# Patient Record
Sex: Male | Born: 1935 | Race: White | Hispanic: No | Marital: Married | Smoking: Never smoker
Health system: Southern US, Community
[De-identification: ages and names within clinical notes are randomized; demographics above are authoritative.]

## PROBLEM LIST (undated history)

## (undated) DIAGNOSIS — D649 Anemia, unspecified: Secondary | ICD-10-CM

## (undated) DIAGNOSIS — Z973 Presence of spectacles and contact lenses: Secondary | ICD-10-CM

## (undated) DIAGNOSIS — I10 Essential (primary) hypertension: Secondary | ICD-10-CM

## (undated) DIAGNOSIS — I2510 Atherosclerotic heart disease of native coronary artery without angina pectoris: Secondary | ICD-10-CM

## (undated) DIAGNOSIS — E669 Obesity, unspecified: Secondary | ICD-10-CM

## (undated) DIAGNOSIS — D494 Neoplasm of unspecified behavior of bladder: Secondary | ICD-10-CM

## (undated) DIAGNOSIS — R350 Frequency of micturition: Secondary | ICD-10-CM

## (undated) DIAGNOSIS — R351 Nocturia: Secondary | ICD-10-CM

## (undated) DIAGNOSIS — D4959 Neoplasm of unspecified behavior of other genitourinary organ: Secondary | ICD-10-CM

## (undated) DIAGNOSIS — E785 Hyperlipidemia, unspecified: Secondary | ICD-10-CM

## (undated) DIAGNOSIS — I219 Acute myocardial infarction, unspecified: Secondary | ICD-10-CM

## (undated) DIAGNOSIS — R319 Hematuria, unspecified: Secondary | ICD-10-CM

## (undated) DIAGNOSIS — R112 Nausea with vomiting, unspecified: Secondary | ICD-10-CM

## (undated) DIAGNOSIS — C801 Malignant (primary) neoplasm, unspecified: Secondary | ICD-10-CM

## (undated) DIAGNOSIS — Z9889 Other specified postprocedural states: Secondary | ICD-10-CM

## (undated) DIAGNOSIS — Z87440 Personal history of urinary (tract) infections: Secondary | ICD-10-CM

## (undated) DIAGNOSIS — K649 Unspecified hemorrhoids: Secondary | ICD-10-CM

## (undated) DIAGNOSIS — I499 Cardiac arrhythmia, unspecified: Secondary | ICD-10-CM

## (undated) HISTORY — PX: CYSTOSCOPY: SUR368

## (undated) HISTORY — PX: CARDIAC CATHETERIZATION: SHX172

## (undated) HISTORY — PX: HEMORROIDECTOMY: SUR656

## (undated) HISTORY — PX: BLADDER SURGERY: SHX569

## (undated) HISTORY — PX: CORONARY ARTERY BYPASS GRAFT: SHX141

---

## 2007-10-07 DIAGNOSIS — D494 Neoplasm of unspecified behavior of bladder: Secondary | ICD-10-CM

## 2007-10-07 DIAGNOSIS — C801 Malignant (primary) neoplasm, unspecified: Secondary | ICD-10-CM

## 2007-10-07 HISTORY — DX: Neoplasm of unspecified behavior of bladder: D49.4

## 2007-10-07 HISTORY — DX: Malignant (primary) neoplasm, unspecified: C80.1

## 2014-12-22 ENCOUNTER — Emergency Department (HOSPITAL_COMMUNITY): Payer: Medicare Other

## 2014-12-22 ENCOUNTER — Emergency Department (HOSPITAL_COMMUNITY)
Admission: EM | Admit: 2014-12-22 | Discharge: 2014-12-22 | Disposition: A | Discharge status: Home / Self Care | Payer: Medicare Other | Attending: Emergency Medicine | Admitting: Emergency Medicine

## 2014-12-22 ENCOUNTER — Encounter (HOSPITAL_COMMUNITY): Payer: Self-pay

## 2014-12-22 ENCOUNTER — Telehealth: Payer: Self-pay | Admitting: *Deleted

## 2014-12-22 DIAGNOSIS — Z79899 Other long term (current) drug therapy: Secondary | ICD-10-CM | POA: Diagnosis not present

## 2014-12-22 DIAGNOSIS — I10 Essential (primary) hypertension: Secondary | ICD-10-CM | POA: Diagnosis not present

## 2014-12-22 DIAGNOSIS — M545 Low back pain, unspecified: Secondary | ICD-10-CM

## 2014-12-22 DIAGNOSIS — Z7982 Long term (current) use of aspirin: Secondary | ICD-10-CM | POA: Insufficient documentation

## 2014-12-22 DIAGNOSIS — E669 Obesity, unspecified: Secondary | ICD-10-CM | POA: Diagnosis not present

## 2014-12-22 DIAGNOSIS — Z7951 Long term (current) use of inhaled steroids: Secondary | ICD-10-CM | POA: Insufficient documentation

## 2014-12-22 DIAGNOSIS — R319 Hematuria, unspecified: Secondary | ICD-10-CM | POA: Insufficient documentation

## 2014-12-22 DIAGNOSIS — Z8551 Personal history of malignant neoplasm of bladder: Secondary | ICD-10-CM | POA: Diagnosis not present

## 2014-12-22 HISTORY — DX: Hyperlipidemia, unspecified: E78.5

## 2014-12-22 HISTORY — DX: Obesity, unspecified: E66.9

## 2014-12-22 HISTORY — DX: Neoplasm of unspecified behavior of bladder: D49.4

## 2014-12-22 HISTORY — DX: Essential (primary) hypertension: I10

## 2014-12-22 HISTORY — DX: Neoplasm of unspecified behavior of other genitourinary organ: D49.59

## 2014-12-22 LAB — COMPREHENSIVE METABOLIC PANEL
ALK PHOS: 67 U/L (ref 39–117)
ALT: 26 U/L (ref 0–53)
AST: 23 U/L (ref 0–37)
Albumin: 3.8 g/dL (ref 3.5–5.2)
Anion gap: 6 (ref 5–15)
BUN: 14 mg/dL (ref 6–23)
CO2: 26 mmol/L (ref 19–32)
CREATININE: 1.01 mg/dL (ref 0.50–1.35)
Calcium: 9.5 mg/dL (ref 8.4–10.5)
Chloride: 108 mmol/L (ref 96–112)
GFR calc Af Amer: 80 mL/min — ABNORMAL LOW (ref >90)
GFR, EST NON AFRICAN AMERICAN: 69 mL/min — AB (ref >90)
GLUCOSE: 104 mg/dL — AB (ref 70–99)
Potassium: 3.9 mmol/L (ref 3.5–5.1)
Sodium: 140 mmol/L (ref 135–145)
TOTAL PROTEIN: 6.5 g/dL (ref 6.0–8.3)
Total Bilirubin: 0.5 mg/dL (ref 0.3–1.2)

## 2014-12-22 LAB — URINE MICROSCOPIC-ADD ON

## 2014-12-22 LAB — URINALYSIS, ROUTINE W REFLEX MICROSCOPIC
Bilirubin Urine: NEGATIVE
Glucose, UA: NEGATIVE mg/dL
Ketones, ur: NEGATIVE mg/dL
Nitrite: NEGATIVE
PROTEIN: NEGATIVE mg/dL
Specific Gravity, Urine: 1.018 (ref 1.005–1.030)
Urobilinogen, UA: 0.2 mg/dL (ref 0.0–1.0)
pH: 5.5 (ref 5.0–8.0)

## 2014-12-22 LAB — CBC WITH DIFFERENTIAL/PLATELET
Basophils Absolute: 0 10*3/uL (ref 0.0–0.1)
Basophils Relative: 1 % (ref 0–1)
EOS PCT: 4 % (ref 0–5)
Eosinophils Absolute: 0.2 10*3/uL (ref 0.0–0.7)
HEMATOCRIT: 39.9 % (ref 39.0–52.0)
Hemoglobin: 13.2 g/dL (ref 13.0–17.0)
Lymphocytes Relative: 18 % (ref 12–46)
Lymphs Abs: 0.9 10*3/uL (ref 0.7–4.0)
MCH: 28.7 pg (ref 26.0–34.0)
MCHC: 33.1 g/dL (ref 30.0–36.0)
MCV: 86.7 fL (ref 78.0–100.0)
MONO ABS: 0.7 10*3/uL (ref 0.1–1.0)
Monocytes Relative: 13 % — ABNORMAL HIGH (ref 3–12)
NEUTROS ABS: 3.3 10*3/uL (ref 1.7–7.7)
Neutrophils Relative %: 64 % (ref 43–77)
Platelets: 162 10*3/uL (ref 150–400)
RBC: 4.6 MIL/uL (ref 4.22–5.81)
RDW: 13.1 % (ref 11.5–15.5)
WBC: 5.1 10*3/uL (ref 4.0–10.5)

## 2014-12-22 LAB — LIPASE, BLOOD: Lipase: 37 U/L (ref 11–59)

## 2014-12-22 MED ORDER — HYDROCODONE-ACETAMINOPHEN 5-325 MG PO TABS: 1.0000 | ORAL_TABLET | ORAL | Status: DC | PRN

## 2014-12-22 MED ORDER — ONDANSETRON 4 MG PO TBDP: ORAL_TABLET | ORAL | Status: DC

## 2014-12-22 MED ORDER — ONDANSETRON HCL 4 MG/2ML IJ SOLN
4.0000 mg | Freq: Once | INTRAMUSCULAR | Status: AC
Start: 2014-12-22 — End: 2014-12-22
  Administered 2014-12-22: 4 mg via INTRAVENOUS
  Filled 2014-12-22: qty 2

## 2014-12-22 MED ORDER — MORPHINE SULFATE 2 MG/ML IJ SOLN
2.0000 mg | Freq: Once | INTRAMUSCULAR | Status: AC
  Administered 2014-12-22: 2 mg via INTRAVENOUS
  Filled 2014-12-22: qty 1

## 2014-12-22 NOTE — ED Notes (Signed)
Pt with bladder cancer history.  Blood in urine.  Started every day this week.  Went to MD yesterday.  Bleeding has eased up.  Urinary pain but decreases post urination.  Able to empty bladder.  No pain.  Rt flank pain

## 2014-12-22 NOTE — ED Provider Notes (Signed)
CSN: [id number]     Arrival date & time 12/22/14  0754 History   First MD Initiated Contact with Patient 12/22/14 0805     Chief Complaint  Patient presents with  . Hematuria  . Flank Pain     (Consider location/radiation/quality/duration/timing/severity/associated sxs/prior Treatment) HPI Patient has a history of bladder cancer. He has however been cleared and had a normal cystoscopy this year. He developed some blood in the urine 5 days ago. He reports he's been urinating exclusively blood up until today when the urine started clearing. He is concerned because she has had severe pain in the right flank as well. Sometimes the pain is extremely intense and it seems to ease after urinating. He describes it as very sharp and indicates his right flank. He has not had fever, chills, nausea or vomiting. He denies any associated abdominal pain. Past Medical History  Diagnosis Date  . Neoplasm of bladder   . Hypertension   . Dyslipidemia   . Obesity   . Neoplasm of urethra    Past Surgical History  Procedure Laterality Date  . Bladder surgery    . Cystoscopy    . Hemorroidectomy     History reviewed. No pertinent family history. History  Substance Use Topics  . Smoking status: Never Smoker   . Smokeless tobacco: Current User  . Alcohol Use: No    Review of Systems  10 Systems reviewed and are negative for acute change except as noted in the HPI.   Allergies  Review of patient's allergies indicates no known allergies.  Home Medications   Prior to Admission medications   Medication Sig Start Date End Date Taking? Authorizing Provider  aspirin 81 MG tablet Take 1 tablet by mouth daily. 08/03/09  Yes Historical Provider, MD  fluticasone (FLONASE) 50 MCG/ACT nasal spray Place 2 sprays into the nose daily as needed for allergies (runny nose).  11/05/12  Yes Historical Provider, MD  lisinopril-hydrochlorothiazide (PRINZIDE,ZESTORETIC) 10-12.5 MG per tablet Take 1 tablet by mouth  daily. 05/22/14  Yes Historical Provider, MD  phenazopyridine (PYRIDIUM) 200 MG tablet Take 1 tablet by mouth daily as needed for pain.    Yes Historical Provider, MD  tamsulosin (FLOMAX) 0.4 MG CAPS capsule Take 1 capsule by mouth daily. Take 1 capsule by mouth 30 minutes after the same meal each day. 05/05/14 05/05/15 Yes Historical Provider, MD  HYDROcodone-acetaminophen (NORCO/VICODIN) 5-325 MG per tablet Take 1-2 tablets by mouth every 4 (four) hours as needed for moderate pain or severe pain. 12/22/14   Charlesetta Shanks, MD  ondansetron (ZOFRAN ODT) 4 MG disintegrating tablet 4mg  ODT q4 hours prn nausea/vomit 12/22/14   Charlesetta Shanks, MD   BP 152/66 mmHg  Pulse 58  Temp(Src) 98.1 F (36.7 C) (Oral)  Resp 18  SpO2 96% Physical Exam  Constitutional: He is oriented to person, place, and time. He appears well-developed and well-nourished.  HENT:  Head: Normocephalic and atraumatic.  Eyes: EOM are normal. Pupils are equal, round, and reactive to light.  Neck: Neck supple.  Cardiovascular: Normal rate, regular rhythm, normal heart sounds and intact distal pulses.   Pulmonary/Chest: Effort normal and breath sounds normal.  Abdominal: Soft. Bowel sounds are normal. He exhibits no distension. There is tenderness.  No reproducible CVA tenderness. Patient however indicates that the area of pain when it happens is in the flank. There is no rash or bruising present. To palpation the patient did endorse discomfort in the right lower quadrant and the suprapubic region. I cannot  appreciate palpable mass.  Musculoskeletal: Normal range of motion. He exhibits no edema.  Neurological: He is alert and oriented to person, place, and time. He has normal strength. Coordination normal. GCS eye subscore is 4. GCS verbal subscore is 5. GCS motor subscore is 6.  Skin: Skin is warm, dry and intact.  Psychiatric: He has a normal mood and affect.    ED Course  Procedures (including critical care time) Labs  Review Labs Reviewed  URINALYSIS, ROUTINE W REFLEX MICROSCOPIC - Abnormal; Notable for the following:    Color, Urine AMBER (*)    APPearance TURBID (*)    Hgb urine dipstick LARGE (*)    Leukocytes, UA TRACE (*)    All other components within normal limits  COMPREHENSIVE METABOLIC PANEL - Abnormal; Notable for the following:    Glucose, Bld 104 (*)    GFR calc non Af Amer 69 (*)    GFR calc Af Amer 80 (*)    All other components within normal limits  CBC WITH DIFFERENTIAL/PLATELET - Abnormal; Notable for the following:    Monocytes Relative 13 (*)    All other components within normal limits  URINE MICROSCOPIC-ADD ON - Abnormal; Notable for the following:    Bacteria, UA FEW (*)    Casts HYALINE CASTS (*)    All other components within normal limits  LIPASE, BLOOD    Imaging Review Ct Renal Stone Study  12/22/2014   CLINICAL DATA:  Right flank pain. History of bladder cancer diagnosed 6 years ago with recent completion of chemotherapy. Blood in urine.  EXAM: CT ABDOMEN AND PELVIS WITHOUT CONTRAST  TECHNIQUE: Multidetector CT imaging of the abdomen and pelvis was performed following the standard protocol without IV contrast.  COMPARISON:  None.  FINDINGS: BODY WALL: No contributory findings.  LOWER CHEST: No contributory findings.  ABDOMEN/PELVIS:  Liver: No focal abnormality.  Biliary: Punctate layering stone in the gallbladder, best visualized on reformats. No acute cholecystitis.  Pancreas: Unremarkable.  Spleen: Unremarkable.  Adrenals: Unremarkable.  Kidneys and ureters: No hydronephrosis or stone. Presumed 22 mm cyst in the lower right kidney.  Bladder: No focal thickening in this patient with history of bladder cancer. No pelvic adenopathy.  Reproductive: Enlarged prostate. Defect near the bladder base may reflect previous trans urethral resection of prostate. Apparent nodular density right lateral of the rectum is related to horizontal asymmetry of the right seminal vesicle. No mass  suspected in this region.  Bowel: Extensive distal colonic diverticulosis. No bowel obstruction or inflammatory wall thickening. Normal appendix.  Retroperitoneum: No mass or adenopathy.  Peritoneum: No ascites or pneumoperitoneum.  Vascular: No acute abnormality.  OSSEOUS: No acute abnormalities.  IMPRESSION: 1. No explanation for acute right flank pain. 2. Prostatomegaly without current urinary retention. 3. Cholelithiasis. 4. Colonic diverticulosis.   Electronically Signed   By: Monte Fantasia M.D.   On: 12/22/2014 08:56     EKG Interpretation None     Consult: Patient's case was reviewed with Dr. Alinda Money of urology. The patient is to call for scheduling of continued outpatient management. MDM   Final diagnoses:  Hematuria  Right-sided low back pain without sciatica   Diagnostic workup does not indicate any anemia or UTI. CT does not show stone or other acute intra-abdominal process, as well there is no evidence of urinary retention. The patient's back pain actually seems to be musculoskeletal in nature. It was worse with movements in the emergency department. There is no radiculopathy associated. The patient will be provided  with pain medication for back pain and instructions for continued urology evaluation for hematuria. Currently there is no indication of urinary retention or clot formation.    Charlesetta Shanks, MD 12/22/14 1101

## 2014-12-22 NOTE — ED Notes (Signed)
MD at bedside. 

## 2014-12-22 NOTE — Discharge Instructions (Signed)

## 2014-12-22 NOTE — Telephone Encounter (Signed)
Pt insurance does not cover ondansetron (ZOFRAN ODT) 4 MG disintegrating tablet. Pharmacy suggested to change to medication covered by insurance.

## 2014-12-22 NOTE — ED Notes (Signed)
Patient transported to CT 

## 2014-12-24 LAB — URINE CULTURE

## 2016-04-05 DIAGNOSIS — I219 Acute myocardial infarction, unspecified: Secondary | ICD-10-CM

## 2016-04-05 HISTORY — DX: Acute myocardial infarction, unspecified: I21.9

## 2016-08-21 ENCOUNTER — Other Ambulatory Visit: Payer: Self-pay | Admitting: Urology

## 2016-08-26 ENCOUNTER — Encounter (HOSPITAL_BASED_OUTPATIENT_CLINIC_OR_DEPARTMENT_OTHER): Payer: Self-pay | Admitting: *Deleted

## 2016-08-26 NOTE — Progress Notes (Addendum)
Spoke with wife-To [facility] at 0700-Istat on arrival-will contact cardiologist Donn Pierini in [place] for recent office visit,Ekg.Instructed Npo after Mn-to refrain from tobacco also-will take diltiazem in am.  ADDENDUM:  RECEIVED D/C NOTE AND EKG FROM SURGEON AT [facility], UPDATED HISTORY IN EPIC AND EKG W/ CHART (IS CURRENT).

## 2016-09-02 ENCOUNTER — Encounter (HOSPITAL_BASED_OUTPATIENT_CLINIC_OR_DEPARTMENT_OTHER): Payer: Self-pay | Admitting: *Deleted

## 2016-09-03 ENCOUNTER — Encounter (HOSPITAL_BASED_OUTPATIENT_CLINIC_OR_DEPARTMENT_OTHER)
Admission: RE | Disposition: A | Discharge status: Home / Self Care | Payer: Self-pay | Source: Ambulatory Visit | Attending: Urology

## 2016-09-03 ENCOUNTER — Ambulatory Visit (HOSPITAL_COMMUNITY): Payer: Medicare Other

## 2016-09-03 ENCOUNTER — Ambulatory Visit (HOSPITAL_BASED_OUTPATIENT_CLINIC_OR_DEPARTMENT_OTHER): Payer: Medicare Other | Admitting: Anesthesiology

## 2016-09-03 ENCOUNTER — Encounter (HOSPITAL_BASED_OUTPATIENT_CLINIC_OR_DEPARTMENT_OTHER): Payer: Self-pay

## 2016-09-03 ENCOUNTER — Ambulatory Visit (HOSPITAL_BASED_OUTPATIENT_CLINIC_OR_DEPARTMENT_OTHER)
Admission: RE | Admit: 2016-09-03 | Discharge: 2016-09-03 | Disposition: A | Discharge status: Home / Self Care | Payer: Medicare Other | Source: Ambulatory Visit | Attending: Urology | Admitting: Urology

## 2016-09-03 DIAGNOSIS — I252 Old myocardial infarction: Secondary | ICD-10-CM | POA: Insufficient documentation

## 2016-09-03 DIAGNOSIS — E785 Hyperlipidemia, unspecified: Secondary | ICD-10-CM | POA: Diagnosis not present

## 2016-09-03 DIAGNOSIS — N1330 Unspecified hydronephrosis: Secondary | ICD-10-CM | POA: Diagnosis not present

## 2016-09-03 DIAGNOSIS — R52 Pain, unspecified: Secondary | ICD-10-CM

## 2016-09-03 DIAGNOSIS — E669 Obesity, unspecified: Secondary | ICD-10-CM | POA: Diagnosis not present

## 2016-09-03 DIAGNOSIS — I10 Essential (primary) hypertension: Secondary | ICD-10-CM | POA: Insufficient documentation

## 2016-09-03 DIAGNOSIS — Z6832 Body mass index (BMI) 32.0-32.9, adult: Secondary | ICD-10-CM | POA: Insufficient documentation

## 2016-09-03 DIAGNOSIS — Z951 Presence of aortocoronary bypass graft: Secondary | ICD-10-CM | POA: Diagnosis not present

## 2016-09-03 DIAGNOSIS — N401 Enlarged prostate with lower urinary tract symptoms: Secondary | ICD-10-CM | POA: Insufficient documentation

## 2016-09-03 DIAGNOSIS — C651 Malignant neoplasm of right renal pelvis: Secondary | ICD-10-CM | POA: Diagnosis not present

## 2016-09-03 DIAGNOSIS — Z8551 Personal history of malignant neoplasm of bladder: Secondary | ICD-10-CM | POA: Insufficient documentation

## 2016-09-03 DIAGNOSIS — F17220 Nicotine dependence, chewing tobacco, uncomplicated: Secondary | ICD-10-CM | POA: Diagnosis not present

## 2016-09-03 DIAGNOSIS — N029 Recurrent and persistent hematuria with unspecified morphologic changes: Secondary | ICD-10-CM | POA: Diagnosis present

## 2016-09-03 DIAGNOSIS — F17290 Nicotine dependence, other tobacco product, uncomplicated: Secondary | ICD-10-CM | POA: Diagnosis not present

## 2016-09-03 DIAGNOSIS — Z79899 Other long term (current) drug therapy: Secondary | ICD-10-CM | POA: Diagnosis not present

## 2016-09-03 DIAGNOSIS — R3912 Poor urinary stream: Secondary | ICD-10-CM | POA: Diagnosis not present

## 2016-09-03 HISTORY — PX: CYSTOSCOPY/RETROGRADE/URETEROSCOPY: SHX5316

## 2016-09-03 HISTORY — PX: CYSTOSCOPY WITH STENT PLACEMENT: SHX5790

## 2016-09-03 HISTORY — DX: Malignant (primary) neoplasm, unspecified: C80.1

## 2016-09-03 HISTORY — DX: Nausea with vomiting, unspecified: R11.2

## 2016-09-03 HISTORY — DX: Acute myocardial infarction, unspecified: I21.9

## 2016-09-03 HISTORY — DX: Nausea with vomiting, unspecified: Z98.890

## 2016-09-03 LAB — POCT I-STAT, CHEM 8
BUN: 20 mg/dL (ref 6–20)
CREATININE: 1.6 mg/dL — AB (ref 0.61–1.24)
Calcium, Ion: 1.26 mmol/L (ref 1.15–1.40)
Chloride: 107 mmol/L (ref 101–111)
Glucose, Bld: 92 mg/dL (ref 65–99)
HEMATOCRIT: 27 % — AB (ref 39.0–52.0)
Hemoglobin: 9.2 g/dL — ABNORMAL LOW (ref 13.0–17.0)
POTASSIUM: 4.1 mmol/L (ref 3.5–5.1)
Sodium: 141 mmol/L (ref 135–145)
TCO2: 23 mmol/L (ref 0–100)

## 2016-09-03 SURGERY — CYSTOSCOPY/RETROGRADE/URETEROSCOPY
Anesthesia: General | Site: Ureter | Laterality: Right

## 2016-09-03 MED ORDER — CEFTRIAXONE SODIUM 1 G IJ SOLR
INTRAMUSCULAR | Status: AC
  Filled 2016-09-03: qty 10

## 2016-09-03 MED ORDER — DEXTROSE 5 % IV SOLN
1.0000 g | INTRAVENOUS | Status: DC
  Administered 2016-09-03: 2 g via INTRAVENOUS
  Filled 2016-09-03: qty 10

## 2016-09-03 MED ORDER — IOHEXOL 300 MG/ML  SOLN
INTRAMUSCULAR | Status: DC | PRN
  Administered 2016-09-03: 10 mL

## 2016-09-03 MED ORDER — LIDOCAINE 2% (20 MG/ML) 5 ML SYRINGE
INTRAMUSCULAR | Status: AC
  Filled 2016-09-03: qty 5

## 2016-09-03 MED ORDER — DEXTROSE 5 % IV SOLN
INTRAVENOUS | Status: AC
  Filled 2016-09-03: qty 50

## 2016-09-03 MED ORDER — DEXTROSE-NACL 5-0.9 % IV SOLN
INTRAVENOUS | Status: DC
  Administered 2016-09-03: 09:00:00 via INTRAVENOUS
  Filled 2016-09-03: qty 1000

## 2016-09-03 MED ORDER — CEFTRIAXONE SODIUM 2 G IJ SOLR
INTRAMUSCULAR | Status: AC
  Filled 2016-09-03: qty 2

## 2016-09-03 MED ORDER — CEFTRIAXONE SODIUM 2 G IJ SOLR
2.0000 g | INTRAMUSCULAR | Status: DC
  Filled 2016-09-03: qty 2

## 2016-09-03 MED ORDER — FENTANYL CITRATE (PF) 100 MCG/2ML IJ SOLN
25.0000 ug | INTRAMUSCULAR | Status: DC | PRN
  Filled 2016-09-03: qty 1

## 2016-09-03 MED ORDER — ONDANSETRON HCL 4 MG/2ML IJ SOLN
INTRAMUSCULAR | Status: AC
  Filled 2016-09-03: qty 2

## 2016-09-03 MED ORDER — LIDOCAINE 2% (20 MG/ML) 5 ML SYRINGE
INTRAMUSCULAR | Status: DC | PRN
  Administered 2016-09-03: 60 mg via INTRAVENOUS

## 2016-09-03 MED ORDER — PROPOFOL 10 MG/ML IV BOLUS
INTRAVENOUS | Status: DC | PRN
  Administered 2016-09-03: 150 mg via INTRAVENOUS

## 2016-09-03 MED ORDER — DEXAMETHASONE SODIUM PHOSPHATE 10 MG/ML IJ SOLN
INTRAMUSCULAR | Status: AC
  Filled 2016-09-03: qty 1

## 2016-09-03 MED ORDER — EPHEDRINE SULFATE-NACL 50-0.9 MG/10ML-% IV SOSY
PREFILLED_SYRINGE | INTRAVENOUS | Status: DC | PRN
  Administered 2016-09-03 (×3): 10 mg via INTRAVENOUS

## 2016-09-03 MED ORDER — DEXAMETHASONE SODIUM PHOSPHATE 4 MG/ML IJ SOLN
INTRAMUSCULAR | Status: DC | PRN
  Administered 2016-09-03: 10 mg via INTRAVENOUS

## 2016-09-03 MED ORDER — ONDANSETRON HCL 4 MG/2ML IJ SOLN
INTRAMUSCULAR | Status: DC | PRN
Start: 2016-09-03 — End: 2016-09-03
  Administered 2016-09-03: 4 mg via INTRAVENOUS

## 2016-09-03 MED ORDER — SENNOSIDES-DOCUSATE SODIUM 8.6-50 MG PO TABS: 1.0000 | ORAL_TABLET | Freq: Two times a day (BID) | ORAL | 0 refills | Status: AC

## 2016-09-03 MED ORDER — MIDAZOLAM HCL 2 MG/2ML IJ SOLN
INTRAMUSCULAR | Status: AC
Start: 2016-09-03 — End: 2016-09-03
  Filled 2016-09-03: qty 2

## 2016-09-03 MED ORDER — LACTATED RINGERS IV SOLN
INTRAVENOUS | Status: DC
  Administered 2016-09-03 (×2): via INTRAVENOUS
  Filled 2016-09-03: qty 1000

## 2016-09-03 MED ORDER — SODIUM CHLORIDE 0.9 % IV SOLN
INTRAVENOUS | Status: DC | PRN
  Administered 2016-09-03: 10:00:00 via INTRAVENOUS

## 2016-09-03 MED ORDER — FENTANYL CITRATE (PF) 100 MCG/2ML IJ SOLN
INTRAMUSCULAR | Status: AC
  Filled 2016-09-03: qty 2

## 2016-09-03 MED ORDER — HYDROCODONE-ACETAMINOPHEN 5-325 MG PO TABS: 1.0000 | ORAL_TABLET | ORAL | 0 refills | Status: DC | PRN

## 2016-09-03 MED ORDER — SODIUM CHLORIDE 0.9 % IR SOLN
Status: DC | PRN
  Administered 2016-09-03: 4000 mL

## 2016-09-03 MED ORDER — FENTANYL CITRATE (PF) 100 MCG/2ML IJ SOLN
INTRAMUSCULAR | Status: DC | PRN
  Administered 2016-09-03: 50 ug via INTRAVENOUS
  Administered 2016-09-03 (×2): 25 ug via INTRAVENOUS

## 2016-09-03 MED ORDER — PROPOFOL 10 MG/ML IV BOLUS
INTRAVENOUS | Status: AC
  Filled 2016-09-03: qty 40

## 2016-09-03 SURGICAL SUPPLY — 27 items
BAG DRAIN URO-CYSTO SKYTR STRL (DRAIN) ×4 IMPLANT
BASKET LASER NITINOL 1.9FR (BASKET) ×4 IMPLANT
BASKET ZERO TIP NITINOL 2.4FR (BASKET) IMPLANT
CATH INTERMIT  6FR 70CM (CATHETERS) IMPLANT
CLOTH BEACON ORANGE TIMEOUT ST (SAFETY) ×4 IMPLANT
DRSG TELFA 3X8 NADH (GAUZE/BANDAGES/DRESSINGS) ×4 IMPLANT
FORCEPS BIOP 2.4F 115CM BACKLD (INSTRUMENTS) ×4 IMPLANT
GLOVE BIO SURGEON STRL SZ7.5 (GLOVE) ×4 IMPLANT
GLOVE ECLIPSE 7.0 STRL STRAW (GLOVE) ×4 IMPLANT
GLOVE INDICATOR 7.0 STRL GRN (GLOVE) ×4 IMPLANT
GOWN STRL REUS W/ TWL XL LVL3 (GOWN DISPOSABLE) ×2 IMPLANT
GOWN STRL REUS W/TWL XL LVL3 (GOWN DISPOSABLE) ×6 IMPLANT
GUIDEWIRE ANG ZIPWIRE 038X150 (WIRE) ×4 IMPLANT
GUIDEWIRE STR DUAL SENSOR (WIRE) ×4 IMPLANT
IV NS 1000ML (IV SOLUTION) ×2
IV NS 1000ML BAXH (IV SOLUTION) ×2 IMPLANT
IV NS IRRIG 3000ML ARTHROMATIC (IV SOLUTION) ×4 IMPLANT
KIT ROOM TURNOVER WOR (KITS) ×4 IMPLANT
MANIFOLD NEPTUNE II (INSTRUMENTS) ×4 IMPLANT
NS IRRIG 500ML POUR BTL (IV SOLUTION) IMPLANT
PACK CYSTO (CUSTOM PROCEDURE TRAY) ×4 IMPLANT
SHEATH ACCESS URETERAL 38CM (SHEATH) ×4 IMPLANT
STENT URET 6FRX26 CONTOUR (STENTS) ×4 IMPLANT
SYRINGE 10CC LL (SYRINGE) ×4 IMPLANT
TUBE CONNECTING 12'X1/4 (SUCTIONS) ×1
TUBE CONNECTING 12X1/4 (SUCTIONS) ×3 IMPLANT
TUBE FEEDING 8FR 16IN STR KANG (MISCELLANEOUS) ×4 IMPLANT

## 2016-09-03 NOTE — H&P (Signed)
Nathaniel Ponce is an 82 y.o. male.    Chief Complaint: Pre-op Cysto, Bilateral REtrogrades, Diagnostic RIGHT ureteroscopy  HPI:   1 - Recurrent Bladder Cancer - long h/o non-muscle invasive bladder cancer managed by Lorine Bears at [facility]. Mostly Ta, T1 some low grade and some high grade. He is s/p BCG induction 2009, Gemzar induction 2012, BCG +INF Re-induction 2015. Most recent reccurence 4cm Ta low grade 05/2014 per records close to UO req peri-op stent.    Recent Surveillance:   02/2015 Cysto NED; 08/2015 Cysto NED   02/2016 Cysto NED; 08/2016 Cysto NED    2 - Enlarged Prostate with Weak Urinary Stream - s/p prior TURP at [facility], now slowly progressive bother again, started finasteride 08/2015.    3 - Gross Hematuria / RIght Hydronephrosis - new gross hematuria and irritative voiding 08/2016. H/o bladder cancer as per above. He states he was treated for cystitis recnetly but gross blood continues to come and go. UCX negative and cysto negative last visit. CT from [facility] with new moderate right hydro and blood density material in collecting system without stone.   PMH sig for HTN, CAD/CABG (no strong blood thinners). His PCP is Dr. Marveen Reeks in [place]. His daughter is RN with [employer] and very involved in his management.    Today " Nathaniel Ponce " is seen to proceed with diagnostic right ureteroscopy with goal of ruling out new right sided upper tract urothelial malignancy. Interval UCX negative.    Past Medical History:  Diagnosis Date  . Cancer [facility]) 2009   bladder cancer  . Dyslipidemia   . Hypertension   . Myocardial infarction 04/2016  . Neoplasm of bladder 2009  . Neoplasm of urethra   . Obesity   . PONV (postoperative nausea and vomiting)     Past Surgical History:  Procedure Laterality Date  . BLADDER SURGERY     TURBT-multiple last 05/22/2014  . CORONARY ARTERY BYPASS GRAFT  04/14/2016   at [facility] (dr Lincoln Brigham)   x3 grafts-- LIMA to LAD,  SVG to PDA,  SVG to OM  . CYSTOSCOPY    .  HEMORROIDECTOMY      History reviewed. No pertinent family history. Social History:  reports that he quit smoking about 45 years ago. His smoking use included Cigars. His smokeless tobacco use includes Chew and Snuff. He reports that he does not drink alcohol or use drugs.  Allergies: No Known Allergies  No prescriptions prior to admission.    No results found for this or any previous visit (from the past 48 hour(s)). No results found.  Review of Systems  Constitutional: Negative.  Negative for chills, fever and malaise/fatigue.  HENT: Negative.   Eyes: Negative.   Respiratory: Negative.   Cardiovascular: Negative.   Gastrointestinal: Negative.   Genitourinary: Positive for hematuria.  Musculoskeletal: Negative.   Skin: Negative.   Neurological: Negative.   Endo/Heme/Allergies: Negative.   Psychiatric/Behavioral: Negative.     Height 5\' 10"  (1.778 m), weight 103.4 kg (228 lb). Physical Exam  Constitutional: He is oriented to person, place, and time. He appears well-developed.  HENT:  Head: Normocephalic.  Eyes: Pupils are equal, round, and reactive to light.  Neck: Normal range of motion.  Cardiovascular: Normal rate.   Respiratory: Effort normal.  GI: Soft.  Genitourinary: Penis normal.  Genitourinary Comments: No CVAT at present  Musculoskeletal: Normal range of motion.  Neurological: He is alert and oriented to person, place, and time.  Skin: Skin is warm.  Psychiatric: He has  a normal mood and affect.     Assessment/Plan  Proceed as planned with likely outpatient diagnostic right ureteroscopy. Risks, benefits, alternatives, expected peri-op course, need for temporary stents / catheters discussed previously and reiterated today.    Alexis Frock, MD 09/03/2016, 6:36 AM

## 2016-09-03 NOTE — Brief Op Note (Signed)
09/03/2016  10:42 AM  PATIENT:  Enid Baas  82 y.o. male  PRE-OPERATIVE DIAGNOSIS:  RIGHT HYDRONEPHROSIS, HISTORY OF BLADDER CANCER  POST-OPERATIVE DIAGNOSIS:  RIGHT HYDRONEPHROSIS, HISTORY OF BLADDER CANCER  PROCEDURE:  Procedure(s): CYSTOSCOPY/RETROGRADE/RIGHT DIAGNOSTIC URETEROSCOPY (Bilateral) CYSTOSCOPY WITH STENT PLACEMENT (Right)  SURGEON:  Surgeon(s) and Role:    * Alexis Frock, MD - Primary  PHYSICIAN ASSISTANT:   ASSISTANTS: none   ANESTHESIA:   general  EBL:  Total I/O In: 650 [I.V.:650] Out: 10 [Blood:10]  BLOOD ADMINISTERED:none  DRAINS: none   LOCAL MEDICATIONS USED:  NONE  SPECIMEN:  Source of Specimen:  1 - Rt renal pelvis mass BX x2  DISPOSITION OF SPECIMEN:  PATHOLOGY  COUNTS:  YES  TOURNIQUET:  * No tourniquets in log *  DICTATION: .Other Dictation: Dictation Number [id number]  PLAN OF CARE: Discharge to home after PACU  PATIENT DISPOSITION:  PACU - hemodynamically stable.   Delay start of Pharmacological VTE agent (>24hrs) due to surgical blood loss or risk of bleeding: yes

## 2016-09-03 NOTE — Discharge Instructions (Signed)
1 - You may have urinary urgency (bladder spasms) and bloody urine on / off with stent in place. This is normal.  2 - Dr. Tresa Moore will call you with pathology results when available.   3 - Call MD or go to ER for fever >102, severe pain / nausea / vomiting not relieved by medications, or acute change in medical status  [facility] [phone] Post Ureteroscopy With or Without Stent Instructions  Definitions:  Ureter: The duct that transports urine from the kidney to the bladder. Stent:   A plastic hollow tube that is placed into the ureter, from the kidney to the                 bladder to prevent the ureter from swelling shut.  GENERAL INSTRUCTIONS:  Despite the fact that no skin incisions were used, the area around the ureter and bladder is raw and irritated. The stent is a foreign body which will further irritate the bladder wall. This irritation is manifested by increased frequency of urination, both day and night, and by an increase in the urge to urinate. In some, the urge to urinate is present almost always. Sometimes the urge is strong enough that you may not be able to stop yourself from urinating. The only real cure is to remove the stent and then give time for the bladder wall to heal which can't be done until the danger of the ureter swelling shut has passed, which varies.  You may see some blood in your urine while the stent is in place and a few days afterwards. Do not be alarmed, even if the urine was clear for a while. Get off your feet and drink lots of fluids until clearing occurs. If you start to pass clots or don't improve, call us.  DIET: You may return to your normal diet immediately. Because of the raw surface of your bladder, alcohol, spicy foods, acid type foods and drinks with caffeine may cause irritation or frequency and should be used in moderation. To keep your urine flowing freely and to avoid constipation, drink plenty of fluids during the day (  8-10 glasses ). Tip: Avoid cranberry juice because it is very acidic.  ACTIVITY: Your physical activity doesn't need to be restricted. However, if you are very active, you may see some blood in your urine. We suggest that you reduce your activity under these circumstances until the bleeding has stopped.  BOWELS: It is important to keep your bowels regular during the postoperative period. Straining with bowel movements can cause bleeding. A bowel movement every other day is reasonable. Use a mild laxative if needed, such as Milk of Magnesia 2-3 tablespoons, or 2 Dulcolax tablets. Call if you continue to have problems. If you have been taking narcotics for pain, before, during or after your surgery, you may be constipated. Take a laxative if necessary.   MEDICATION: You should resume your pre-surgery medications unless told not to. In addition you will often be given an antibiotic to prevent infection. These should be taken as prescribed until the bottles are finished unless you are having an unusual reaction to one of the drugs.  PROBLEMS YOU SHOULD REPORT TO [place]:  Fevers over 100.5 Fahrenheit.  Heavy bleeding, or clots ( See above notes about blood in urine ).  Inability to urinate.  Drug reactions ( hives, rash, nausea, vomiting, diarrhea ).  Severe burning or pain with urination that is not improving.  FOLLOW-UP: You will need a  follow-up appointment to monitor your progress. Call for this appointment at the number listed above. Usually the first appointment will be about three to fourteen days after your surgery.    Post Anesthesia Home Care Instructions  Activity: Get plenty of rest for the remainder of the day. A responsible adult should stay with you for 24 hours following the procedure.  For the next 24 hours, DO NOT: -Drive a car -Paediatric nurse -Drink alcoholic beverages -Take any medication unless instructed by your physician -Make any legal decisions or sign  important papers.  Meals: Start with liquid foods such as gelatin or soup. Progress to regular foods as tolerated. Avoid greasy, spicy, heavy foods. If nausea and/or vomiting occur, drink only clear liquids until the nausea and/or vomiting subsides. Call your physician if vomiting continues.  Special Instructions/Symptoms: Your throat may feel dry or sore from the anesthesia or the breathing tube placed in your throat during surgery. If this causes discomfort, gargle with warm salt water. The discomfort should disappear within 24 hours.  If you had a scopolamine patch placed behind your ear for the management of post- operative nausea and/or vomiting:  1. The medication in the patch is effective for 72 hours, after which it should be removed.  Wrap patch in a tissue and discard in the trash. Wash hands thoroughly with soap and water. 2. You may remove the patch earlier than 72 hours if you experience unpleasant side effects which may include dry mouth, dizziness or visual disturbances. 3. Avoid touching the patch. Wash your hands with soap and water after contact with the patch.

## 2016-09-03 NOTE — Anesthesia Postprocedure Evaluation (Signed)
Anesthesia Post Note  Patient: Nathaniel Ponce  Procedure(s) Performed: Procedure(s) (LRB): CYSTOSCOPY/RETROGRADE/RIGHT DIAGNOSTIC URETEROSCOPY (Bilateral) CYSTOSCOPY WITH STENT PLACEMENT (Right)  Patient location during evaluation: PACU Anesthesia Type: General Level of consciousness: awake and alert Pain management: pain level controlled Vital Signs Assessment: post-procedure vital signs reviewed and stable Respiratory status: spontaneous breathing, nonlabored ventilation, respiratory function stable and patient connected to nasal cannula oxygen Cardiovascular status: blood pressure returned to baseline and stable Postop Assessment: no signs of nausea or vomiting Anesthetic complications: no    Last Vitals:  Vitals:   09/03/16 1145 09/03/16 1222  BP: (!) 147/65 (!) 158/69  Pulse: 71   Resp: 12 16  Temp:  37.1 C    Last Pain:  Vitals:   09/03/16 1222  TempSrc: Tympanic                 Effie Berkshire

## 2016-09-03 NOTE — Anesthesia Preprocedure Evaluation (Addendum)
Anesthesia Evaluation  Patient identified by MRN, date of birth, ID band Patient awake    History of Anesthesia Complications (+) PONV  Airway Mallampati: I  TM Distance: >3 FB Neck ROM: Full    Dental  (+) Edentulous Upper, Edentulous Lower   Pulmonary former smoker,    breath sounds clear to auscultation       Cardiovascular Exercise Tolerance: Good hypertension, Pt. on medications + CAD and + Past MI   Rhythm:Regular Rate:Normal     Neuro/Psych Depression    GI/Hepatic negative GI ROS, Neg liver ROS,   Endo/Other  negative endocrine ROS  Renal/GU negative Renal ROS     Musculoskeletal negative musculoskeletal ROS (+)   Abdominal   Peds  Hematology negative hematology ROS (+)   Anesthesia Other Findings   Reproductive/Obstetrics                            Anesthesia Physical Anesthesia Plan  ASA: III  Anesthesia Plan: General   Post-op Pain Management:    Induction: Intravenous  Airway Management Planned: LMA  Additional Equipment:   Intra-op Plan:   Post-operative Plan: Extubation in OR  Informed Consent: I have reviewed the patients History and Physical, chart, labs and discussed the procedure including the risks, benefits and alternatives for the proposed anesthesia with the patient or authorized representative who has indicated his/her understanding and acceptance.   Dental advisory given  Plan Discussed with: CRNA  Anesthesia Plan Comments: (Spoke with Dr. Sondra Come on the phone. He will fax over recent visit. Per his evaluation, he is cleared from a cardiac standpoint. )        Anesthesia Quick Evaluation

## 2016-09-03 NOTE — Transfer of Care (Signed)
Immediate Anesthesia Transfer of Care Note  Patient: Nathaniel Ponce  Procedure(s) Performed: Procedure(s): CYSTOSCOPY/RETROGRADE/RIGHT DIAGNOSTIC URETEROSCOPY (Bilateral) CYSTOSCOPY WITH STENT PLACEMENT (Right)  Patient Location: PACU  Anesthesia Type:General  Level of Consciousness: awake, alert , oriented and patient cooperative  Airway & Oxygen Therapy: Patient Spontanous Breathing and Patient connected to nasal cannula oxygen  Post-op Assessment: Report given to RN and Post -op Vital signs reviewed and stable  Post vital signs: Reviewed and stable  Last Vitals:  Vitals:   09/03/16 0729  BP: (!) 148/89  Pulse: 65  Resp: 20  Temp: 36.8 C    Last Pain:  Vitals:   09/03/16 0729  TempSrc: Oral      Patients Stated Pain Goal: 8 ([id number])  Complications: No apparent anesthesia complications

## 2016-09-03 NOTE — Anesthesia Procedure Notes (Signed)
Procedure Name: LMA Insertion Date/Time: 09/03/2016 9:44 AM Performed by: Wanita Chamberlain Pre-anesthesia Checklist: Patient identified, Timeout performed, Emergency Drugs available, Suction available and Patient being monitored Patient Re-evaluated:Patient Re-evaluated prior to inductionOxygen Delivery Method: Circle system utilized Preoxygenation: Pre-oxygenation with 100% oxygen Intubation Type: IV induction Ventilation: Mask ventilation without difficulty LMA: LMA inserted LMA Size: 5.0 Number of attempts: 1 Placement Confirmation: breath sounds checked- equal and bilateral and positive ETCO2 Tube secured with: Tape Dental Injury: Teeth and Oropharynx as per pre-operative assessment

## 2016-09-04 ENCOUNTER — Encounter (HOSPITAL_BASED_OUTPATIENT_CLINIC_OR_DEPARTMENT_OTHER): Payer: Self-pay | Admitting: Urology

## 2016-09-04 NOTE — Op Note (Signed)
[name], Ponce NO.:  [id number]  MEDICAL RECORD NO.:  [id number]  LOCATION:                                 FACILITY:  PHYSICIAN:  Alexis Frock, MD     DATE OF BIRTH:  [birth year]  DATE OF PROCEDURE: 09/03/2016                              OPERATIVE REPORT   PREOPERATIVE DIAGNOSES:  Recurrent hematuria, questionable right renal pelvis mass, right hydronephrosis, history of bladder cancer.  PROCEDURES: 1. Cystoscopy with bilateral retrograde pyelogram with interpretation. 2. Right ureteroscopy with biopsy. 3. Insertion of right ureteral stent, 6 x 26, Contour, no tether.  ESTIMATED BLOOD LOSS:  Nil.  COMPLICATION:  None.  SPECIMEN:  Right renal pelvis mass biopsy x2 for permanent pathology.  FINDINGS: 1. Unremarkable urinary bladder. 2. Unremarkable left retrograde pyelogram. 3. Very large right renal pelvis filling defect consistent with     urothelial carcinoma. 4. Multiple in-situ blood clots within right ureter, likely source of     the patient's hematuria and right-sided flank pain.  INDICATIONS:  Nathaniel Ponce is an 82 year old gentleman with long history of superficial bladder cancer.  He has been cared for by multiple providers throughout the [place] previously.  He has transferred his care to me recently.  He has been compliant with bladder surveillance and he developed recent new-onset gross hematuria.  He underwent a CT scan in [place] that the family was told was normal.  His most recent cystoscopy was unremarkable and adamantly requested review of the images.  The patient's family brought the images for my review and the CT scan was markedly abnormal with new right hydronephrosis and likely soft tissue density within the right renal pelvis, this is highly concerning for new right renal pelvis cancer.  It was felt that cystoscopy with retrograde pyelography and ureteroscopy with possible biopsy were clearly indicated and they  wished to proceed.  Informed consent was obtained and placed in the medical record.  PROCEDURE IN DETAIL:  The patient being Nathaniel Ponce, was verified. Procedure being cysto, bilateral retrogrades and right ureteroscopy was confirmed.  Procedure was carried out.  Time-out was performed. Intravenous antibiotics were administered.  General LMA anesthesia was introduced.  The patient was placed into a low lithotomy position and sterile field was created by prepping and draping the patient's penis, perineum and proximal thighs using iodine x3.  Next, cystourethroscopy was performed using a rigid cystoscope with offset lens.  Inspection of the anterior and posterior urethra revealed only prior TURP defect. Inspection of the urinary bladder revealed no diverticula, calcifications, papillary lesions.  There was evidence of prior resection of ureteral orifices, but there were bilaterally patent.  The left ureteral orifice was cannulated with a 6-French end-hole catheter and left retrograde pyelogram was obtained.  Left retrograde pyelogram demonstrated a single left ureter with single- system left kidney.  No filling defects or narrowing noted.  Similarly, right retrograde pyelogram was obtained.  Retrograde pyelogram demonstrated a single right ureter with single- system right kidney.  There was mobile large filling defect in the right ureter likely consistent with blood clot.  There was a very large filling defect within the right renal pelvis and upper pole area,  highly worrisome for primary urothelial carcinoma of the kidney.  A 0.038 Zip wire was advanced to the level of the upper pole, set aside as a safety wire.  An 8-French feeding tube was placed in the urinary bladder for pressure release and semi-rigid ureteroscopy was performed of the distal four-fifths of the right ureter alongside a separate Sensor working wire.  As expected, there were multifocal long stringy blood  clots throughout the right ureter, but without urothelial lesions from the ureter, this was consistent with likely renal bleeding.  The semi-rigid scope was then exchanged for 12/14, 38-cm ureteral access sheath at the level of the proximal ureter and flexible digital ureteroscopy was performed of the proximal right ureter and systematic inspection of the right kidney.  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 It was clearly felt that biopsy was warranted.  As such, an Escape basket was used to snare some small pieces of tissue that was set aside for permanent pathology.  Also, a BIGopsy apparatus was backloaded and using combination of ureteroscopic and radiographic guidance, multiple additional biopsies were taken of the renal pelvis mass, set aside for permanent pathology.  Given the patient's ongoing bleeding and clot colic, it was felt that interval stenting would clearly be warranted. As such, the sheath was removed under continuous vision, no mucosal abnormalities were found, and a new 6 x 26 Contour-type stent was placed over the remaining safety wire using cystoscopic and fluoroscopic guidance.  Good proximal and distal deployment were noted.  Bladder was emptied per cystoscope, procedure was then terminated.  The patient tolerated the procedure well.  There were no immediate periprocedural complications.  The patient was taken to the postanesthesia care unit in stable condition.    ______________________________ Alexis Frock, MD   ______________________________ Alexis Frock, MD    TM/MEDQ  D:  09/03/2016  T:  09/04/2016  Job:  [id number]

## 2016-09-15 ENCOUNTER — Other Ambulatory Visit: Payer: Self-pay | Admitting: Urology

## 2016-09-16 NOTE — Progress Notes (Signed)
Pt is being scheduled for preop appt; please place surgical orders in epic. Thanks.  

## 2016-10-02 ENCOUNTER — Ambulatory Visit (HOSPITAL_COMMUNITY)
Admission: RE | Admit: 2016-10-02 | Discharge: 2016-10-02 | Disposition: A | Discharge status: Home / Self Care | Payer: Medicare Other | Source: Ambulatory Visit | Attending: Anesthesiology | Admitting: Anesthesiology

## 2016-10-02 ENCOUNTER — Encounter (HOSPITAL_COMMUNITY): Payer: Self-pay

## 2016-10-02 ENCOUNTER — Encounter (HOSPITAL_COMMUNITY)
Admission: RE | Admit: 2016-10-02 | Discharge: 2016-10-02 | Disposition: A | Discharge status: Home / Self Care | Payer: Medicare Other | Source: Ambulatory Visit | Attending: Urology | Admitting: Urology

## 2016-10-02 DIAGNOSIS — Z01818 Encounter for other preprocedural examination: Secondary | ICD-10-CM | POA: Diagnosis present

## 2016-10-02 DIAGNOSIS — Z01812 Encounter for preprocedural laboratory examination: Secondary | ICD-10-CM | POA: Insufficient documentation

## 2016-10-02 DIAGNOSIS — R9389 Abnormal findings on diagnostic imaging of other specified body structures: Secondary | ICD-10-CM

## 2016-10-02 DIAGNOSIS — C651 Malignant neoplasm of right renal pelvis: Secondary | ICD-10-CM | POA: Diagnosis not present

## 2016-10-02 DIAGNOSIS — L905 Scar conditions and fibrosis of skin: Secondary | ICD-10-CM | POA: Diagnosis not present

## 2016-10-02 HISTORY — DX: Nocturia: R35.1

## 2016-10-02 HISTORY — DX: Unspecified hemorrhoids: K64.9

## 2016-10-02 HISTORY — DX: Hematuria, unspecified: R31.9

## 2016-10-02 HISTORY — DX: Frequency of micturition: R35.0

## 2016-10-02 HISTORY — DX: Presence of spectacles and contact lenses: Z97.3

## 2016-10-02 HISTORY — DX: Atherosclerotic heart disease of native coronary artery without angina pectoris: I25.10

## 2016-10-02 HISTORY — DX: Anemia, unspecified: D64.9

## 2016-10-02 HISTORY — DX: Personal history of urinary (tract) infections: Z87.440

## 2016-10-02 HISTORY — DX: Cardiac arrhythmia, unspecified: I49.9

## 2016-10-02 LAB — CBC
HCT: 27.3 % — ABNORMAL LOW (ref 39.0–52.0)
HEMOGLOBIN: 8.1 g/dL — AB (ref 13.0–17.0)
MCH: 20.9 pg — AB (ref 26.0–34.0)
MCHC: 29.7 g/dL — AB (ref 30.0–36.0)
MCV: 70.5 fL — AB (ref 78.0–100.0)
Platelets: 404 10*3/uL — ABNORMAL HIGH (ref 150–400)
RBC: 3.87 MIL/uL — AB (ref 4.22–5.81)
RDW: 16.1 % — ABNORMAL HIGH (ref 11.5–15.5)
WBC: 7.5 10*3/uL (ref 4.0–10.5)

## 2016-10-02 LAB — BASIC METABOLIC PANEL
Anion gap: 7 (ref 5–15)
BUN: 19 mg/dL (ref 6–20)
CALCIUM: 9.9 mg/dL (ref 8.9–10.3)
CHLORIDE: 106 mmol/L (ref 101–111)
CO2: 26 mmol/L (ref 22–32)
CREATININE: 1.41 mg/dL — AB (ref 0.61–1.24)
GFR calc non Af Amer: 45 mL/min — ABNORMAL LOW (ref >60)
GFR, EST AFRICAN AMERICAN: 53 mL/min — AB (ref >60)
GLUCOSE: 96 mg/dL (ref 65–99)
Potassium: 4.3 mmol/L (ref 3.5–5.1)
Sodium: 139 mmol/L (ref 135–145)

## 2016-10-02 LAB — ABO/RH: ABO/RH(D): O POS

## 2016-10-02 NOTE — Progress Notes (Signed)
CBC results in epic per PAT visit 10/02/2016 sent to Dr Tresa Moore

## 2016-10-02 NOTE — Progress Notes (Signed)
BMP results in epic per PAT visit 10/02/2016 sent to Dr Tresa Moore

## 2016-10-02 NOTE — Progress Notes (Addendum)
EKG per chart 05/01/2016  OV note per chart per Gwinda Maine PA/cardiothoracic surgery 05/01/2016  ECHO report per chart 04/09/2016  Clearance note per chart per Dr Sondra Come which discussed aspirin; discussed this with Dr Glennon Mac / anesthesia whom stated pt needs to continue 81 mg aspirin daily. Family is aware to restart pt back on aspirin today-had stopped on Tuesday 09/30/2016

## 2016-10-02 NOTE — Patient Instructions (Signed)
Nathaniel Ponce  10/02/2016   Your procedure is scheduled on: Wednesday October 08, 2016  Report to [facility]  Entrance take [place]  elevators to 3rd floor to  [place] at 11:00 AM.  Call this number if you have problems the morning of surgery [phone]   Remember: ONLY 1 PERSON MAY GO WITH YOU TO SHORT STAY TO GET  READY MORNING OF [place].  Do not eat food After Midnight but may take clear liquid diet till 7:00 am day of surgery then nothing by mouth.      Take these medicines the morning of surgery with A SIP OF WATER: Diltiazem; Finasteride (Proscar); May use Flonase if needed                                 You may not have any metal on your body including hair pins and              piercings  Do not wear jewelry,  lotions, powders or colognes, deodorant              Men may shave face and neck.   Do not bring valuables to the hospital. [place].  Contacts, dentures or bridgework may not be worn into surgery.  Leave suitcase in the car. After surgery it may be brought to your room.    Special Instructions: FOLLOW SURGEON'S INSTRUCTIONS IN REGARDS TO BOWEL PREPARATION PRIOR TO SURGICAL PROCEDURE DATE           _____________________________________________________________________             [facility] - Preparing for Surgery Before surgery, you can play an important role.  Because skin is not sterile, your skin needs to be as free of germs as possible.  You can reduce the number of germs on your skin by washing with CHG (chlorahexidine gluconate) soap before surgery.  CHG is an antiseptic cleaner which kills germs and bonds with the skin to continue killing germs even after washing. Please DO NOT use if you have an allergy to CHG or antibacterial soaps.  If your skin becomes reddened/irritated stop using the CHG and inform your nurse when you arrive at Short Stay. Do not shave  (including legs and underarms) for at least 48 hours prior to the first CHG shower.  You may shave your face/neck. Please follow these instructions carefully:  1.  Shower with CHG Soap the night before surgery and the  morning of Surgery.  2.  If you choose to wash your hair, wash your hair first as usual with your  normal  shampoo.  3.  After you shampoo, rinse your hair and body thoroughly to remove the  shampoo.                           4.  Use CHG as you would any other liquid soap.  You can apply chg directly  to the skin and wash                       Gently with a scrungie or clean washcloth.  5.  Apply the CHG Soap to your  body ONLY FROM THE NECK DOWN.   Do not use on face/ open                           Wound or open sores. Avoid contact with eyes, ears mouth and genitals (private parts).                       Wash face,  Genitals (private parts) with your normal soap.             6.  Wash thoroughly, paying special attention to the area where your surgery  will be performed.  7.  Thoroughly rinse your body with warm water from the neck down.  8.  DO NOT shower/wash with your normal soap after using and rinsing off  the CHG Soap.                9.  Pat yourself dry with a clean towel.            10.  Wear clean pajamas.            11.  Place clean sheets on your bed the night of your first shower and do not  sleep with pets. Day of Surgery : Do not apply any lotions/deodorants the morning of surgery.  Please wear clean clothes to the hospital/surgery center.  FAILURE TO FOLLOW THESE INSTRUCTIONS MAY RESULT IN THE CANCELLATION OF YOUR SURGERY PATIENT SIGNATURE_________________________________  NURSE SIGNATURE__________________________________  ________________________________________________________________________    CLEAR LIQUID DIET   Foods Allowed                                                                     Foods Excluded  Coffee and tea, regular and decaf                              liquids that you cannot  Plain Jell-O in any flavor                                             see through such as: Fruit ices (not with fruit pulp)                                     milk, soups, orange juice  Iced Popsicles                                    All solid food Carbonated beverages, regular and diet                                    Cranberry, grape and apple juices Sports drinks like Gatorade Lightly seasoned clear broth or consume(fat free) Sugar, honey syrup  Sample Menu Breakfast  Lunch                                     Supper Cranberry juice                    Beef broth                            Chicken broth Jell-O                                     Grape juice                           Apple juice Coffee or tea                        Jell-O                                      Popsicle                                                Coffee or tea                        Coffee or tea  _____________________________________________________________________

## 2016-10-08 ENCOUNTER — Inpatient Hospital Stay (HOSPITAL_COMMUNITY): Payer: Medicare Other | Admitting: Anesthesiology

## 2016-10-08 ENCOUNTER — Encounter (HOSPITAL_COMMUNITY)
Admission: RE | Disposition: A | Discharge status: Home / Self Care | Payer: Self-pay | Source: Ambulatory Visit | Attending: Urology

## 2016-10-08 ENCOUNTER — Inpatient Hospital Stay (HOSPITAL_COMMUNITY)
Admission: RE | Admit: 2016-10-08 | Discharge: 2016-10-10 | DRG: 657 | Disposition: A | Discharge status: Home / Self Care | Payer: Medicare Other | Source: Ambulatory Visit | Attending: Urology | Admitting: Urology

## 2016-10-08 ENCOUNTER — Encounter (HOSPITAL_COMMUNITY): Payer: Self-pay | Admitting: *Deleted

## 2016-10-08 DIAGNOSIS — I252 Old myocardial infarction: Secondary | ICD-10-CM

## 2016-10-08 DIAGNOSIS — I2510 Atherosclerotic heart disease of native coronary artery without angina pectoris: Secondary | ICD-10-CM | POA: Diagnosis present

## 2016-10-08 DIAGNOSIS — D500 Iron deficiency anemia secondary to blood loss (chronic): Secondary | ICD-10-CM | POA: Diagnosis present

## 2016-10-08 DIAGNOSIS — Z720 Tobacco use: Secondary | ICD-10-CM | POA: Diagnosis not present

## 2016-10-08 DIAGNOSIS — N029 Recurrent and persistent hematuria with unspecified morphologic changes: Secondary | ICD-10-CM | POA: Diagnosis present

## 2016-10-08 DIAGNOSIS — I10 Essential (primary) hypertension: Secondary | ICD-10-CM | POA: Diagnosis present

## 2016-10-08 DIAGNOSIS — E785 Hyperlipidemia, unspecified: Secondary | ICD-10-CM | POA: Diagnosis present

## 2016-10-08 DIAGNOSIS — Z951 Presence of aortocoronary bypass graft: Secondary | ICD-10-CM

## 2016-10-08 DIAGNOSIS — C661 Malignant neoplasm of right ureter: Secondary | ICD-10-CM | POA: Diagnosis present

## 2016-10-08 DIAGNOSIS — Z8551 Personal history of malignant neoplasm of bladder: Secondary | ICD-10-CM

## 2016-10-08 DIAGNOSIS — C641 Malignant neoplasm of right kidney, except renal pelvis: Secondary | ICD-10-CM

## 2016-10-08 DIAGNOSIS — C651 Malignant neoplasm of right renal pelvis: Principal | ICD-10-CM | POA: Diagnosis present

## 2016-10-08 HISTORY — PX: ROBOT ASSITED LAPAROSCOPIC NEPHROURETERECTOMY: SHX6077

## 2016-10-08 LAB — HEMOGLOBIN AND HEMATOCRIT, BLOOD
HCT: 28.5 % — ABNORMAL LOW (ref 39.0–52.0)
Hemoglobin: 8.7 g/dL — ABNORMAL LOW (ref 13.0–17.0)

## 2016-10-08 LAB — PREPARE RBC (CROSSMATCH)

## 2016-10-08 SURGERY — NEPHROURETERECTOMY, ROBOT-ASSISTED, LAPAROSCOPIC
Anesthesia: General | Site: Abdomen | Laterality: Right

## 2016-10-08 MED ORDER — ONDANSETRON HCL 4 MG/2ML IJ SOLN
INTRAMUSCULAR | Status: AC
  Filled 2016-10-08: qty 2

## 2016-10-08 MED ORDER — LACTATED RINGERS IV SOLN
INTRAVENOUS | Status: DC | PRN
  Administered 2016-10-08 (×3): via INTRAVENOUS

## 2016-10-08 MED ORDER — ROCURONIUM BROMIDE 50 MG/5ML IV SOSY
PREFILLED_SYRINGE | INTRAVENOUS | Status: AC
  Filled 2016-10-08: qty 5

## 2016-10-08 MED ORDER — FINASTERIDE 5 MG PO TABS
5.0000 mg | ORAL_TABLET | Freq: Every day | ORAL | Status: DC
  Administered 2016-10-09 – 2016-10-10 (×2): 5 mg via ORAL
  Filled 2016-10-08 (×2): qty 1

## 2016-10-08 MED ORDER — SODIUM CHLORIDE 0.9 % IR SOLN
Status: DC | PRN
  Administered 2016-10-08: 3000 mL

## 2016-10-08 MED ORDER — PROPOFOL 10 MG/ML IV BOLUS
INTRAVENOUS | Status: AC
  Filled 2016-10-08: qty 20

## 2016-10-08 MED ORDER — DOCUSATE SODIUM 100 MG PO CAPS
100.0000 mg | ORAL_CAPSULE | Freq: Two times a day (BID) | ORAL | Status: DC
  Administered 2016-10-08 – 2016-10-10 (×4): 100 mg via ORAL
  Filled 2016-10-08 (×4): qty 1

## 2016-10-08 MED ORDER — SUCCINYLCHOLINE CHLORIDE 200 MG/10ML IV SOSY
PREFILLED_SYRINGE | INTRAVENOUS | Status: AC
Start: 2016-10-08 — End: 2016-10-08
  Filled 2016-10-08: qty 10

## 2016-10-08 MED ORDER — OXYCODONE HCL 5 MG PO TABS: 5.0000 mg | ORAL_TABLET | ORAL | Status: DC | PRN

## 2016-10-08 MED ORDER — SENNA 8.6 MG PO TABS
1.0000 | ORAL_TABLET | Freq: Two times a day (BID) | ORAL | Status: DC
  Administered 2016-10-08 – 2016-10-10 (×4): 8.6 mg via ORAL
  Filled 2016-10-08 (×4): qty 1

## 2016-10-08 MED ORDER — SUGAMMADEX SODIUM 200 MG/2ML IV SOLN
INTRAVENOUS | Status: AC
  Filled 2016-10-08: qty 2

## 2016-10-08 MED ORDER — BOOST / RESOURCE BREEZE PO LIQD
1.0000 | Freq: Three times a day (TID) | ORAL | Status: DC
  Administered 2016-10-09: 1 via ORAL

## 2016-10-08 MED ORDER — ACETAMINOPHEN 500 MG PO TABS
1000.0000 mg | ORAL_TABLET | Freq: Four times a day (QID) | ORAL | Status: AC
  Administered 2016-10-08 – 2016-10-09 (×4): 1000 mg via ORAL
  Filled 2016-10-08 (×4): qty 2

## 2016-10-08 MED ORDER — EPHEDRINE 5 MG/ML INJ
INTRAVENOUS | Status: AC
  Filled 2016-10-08: qty 10

## 2016-10-08 MED ORDER — OXYCODONE HCL 5 MG PO TABS
5.0000 mg | ORAL_TABLET | ORAL | Status: DC | PRN
  Administered 2016-10-10: 10 mg via ORAL
  Filled 2016-10-08: qty 2

## 2016-10-08 MED ORDER — SODIUM CHLORIDE 0.9 % IV SOLN
Freq: Once | INTRAVENOUS | Status: AC
  Administered 2016-10-08: 14:00:00 via INTRAVENOUS

## 2016-10-08 MED ORDER — FLUTICASONE PROPIONATE 50 MCG/ACT NA SUSP: 2.0000 | Freq: Every day | NASAL | Status: DC | PRN

## 2016-10-08 MED ORDER — LABETALOL HCL 5 MG/ML IV SOLN
INTRAVENOUS | Status: AC
  Administered 2016-10-08: 5 mg via INTRAVENOUS
  Filled 2016-10-08: qty 4

## 2016-10-08 MED ORDER — BUPIVACAINE LIPOSOME 1.3 % IJ SUSP
INTRAMUSCULAR | Status: AC
  Filled 2016-10-08: qty 20

## 2016-10-08 MED ORDER — ONDANSETRON HCL 4 MG/2ML IJ SOLN: 4.0000 mg | INTRAMUSCULAR | Status: DC | PRN

## 2016-10-08 MED ORDER — LIDOCAINE 2% (20 MG/ML) 5 ML SYRINGE
INTRAMUSCULAR | Status: AC
Start: 2016-10-08 — End: 2016-10-08
  Filled 2016-10-08: qty 5

## 2016-10-08 MED ORDER — LABETALOL HCL 5 MG/ML IV SOLN
5.0000 mg | INTRAVENOUS | Status: AC | PRN
  Administered 2016-10-08 (×2): 5 mg via INTRAVENOUS

## 2016-10-08 MED ORDER — SODIUM CHLORIDE 0.9 % IV SOLN: Freq: Once | INTRAVENOUS | Status: DC

## 2016-10-08 MED ORDER — BUPIVACAINE LIPOSOME 1.3 % IJ SUSP
INTRAMUSCULAR | Status: DC | PRN
  Administered 2016-10-08: 20 mL

## 2016-10-08 MED ORDER — DILTIAZEM HCL ER 180 MG PO CP24
180.0000 mg | ORAL_CAPSULE | Freq: Every day | ORAL | Status: DC
  Administered 2016-10-09 – 2016-10-10 (×2): 180 mg via ORAL
  Filled 2016-10-08 (×4): qty 1

## 2016-10-08 MED ORDER — ONDANSETRON HCL 4 MG/2ML IJ SOLN
INTRAMUSCULAR | Status: DC | PRN
  Administered 2016-10-08: 4 mg via INTRAVENOUS

## 2016-10-08 MED ORDER — LACTATED RINGERS IV SOLN
INTRAVENOUS | Status: DC | PRN
  Administered 2016-10-08: 13:00:00 via INTRAVENOUS

## 2016-10-08 MED ORDER — OXYCODONE-ACETAMINOPHEN 5-325 MG PO TABS: 1.0000 | ORAL_TABLET | ORAL | 0 refills | Status: DC | PRN

## 2016-10-08 MED ORDER — FENTANYL CITRATE (PF) 100 MCG/2ML IJ SOLN
INTRAMUSCULAR | Status: AC
  Filled 2016-10-08: qty 2

## 2016-10-08 MED ORDER — LACTATED RINGERS IV SOLN
INTRAVENOUS | Status: DC
  Administered 2016-10-09: 07:00:00 via INTRAVENOUS

## 2016-10-08 MED ORDER — CEFAZOLIN SODIUM-DEXTROSE 2-4 GM/100ML-% IV SOLN
2.0000 g | INTRAVENOUS | Status: AC
  Administered 2016-10-08: 2 g via INTRAVENOUS

## 2016-10-08 MED ORDER — SODIUM CHLORIDE 0.9 % IJ SOLN
INTRAMUSCULAR | Status: DC | PRN
  Administered 2016-10-08: 20 mL

## 2016-10-08 MED ORDER — CEFAZOLIN SODIUM-DEXTROSE 2-4 GM/100ML-% IV SOLN
INTRAVENOUS | Status: AC
  Filled 2016-10-08: qty 100

## 2016-10-08 MED ORDER — MEPERIDINE HCL 50 MG/ML IJ SOLN: 6.2500 mg | INTRAMUSCULAR | Status: DC | PRN

## 2016-10-08 MED ORDER — PROMETHAZINE HCL 25 MG/ML IJ SOLN: 6.2500 mg | INTRAMUSCULAR | Status: DC | PRN

## 2016-10-08 MED ORDER — ATORVASTATIN CALCIUM 10 MG PO TABS
20.0000 mg | ORAL_TABLET | Freq: Every day | ORAL | Status: DC
  Administered 2016-10-09: 20 mg via ORAL
  Filled 2016-10-08 (×2): qty 2

## 2016-10-08 MED ORDER — HYDROMORPHONE HCL 1 MG/ML IJ SOLN
INTRAMUSCULAR | Status: AC
  Administered 2016-10-08: 0.5 mg via INTRAVENOUS
  Filled 2016-10-08: qty 1

## 2016-10-08 MED ORDER — ROCURONIUM BROMIDE 10 MG/ML (PF) SYRINGE
PREFILLED_SYRINGE | INTRAVENOUS | Status: DC | PRN
  Administered 2016-10-08: 10 mg via INTRAVENOUS
  Administered 2016-10-08: 50 mg via INTRAVENOUS
  Administered 2016-10-08 (×3): 10 mg via INTRAVENOUS

## 2016-10-08 MED ORDER — LIDOCAINE 2% (20 MG/ML) 5 ML SYRINGE
INTRAMUSCULAR | Status: DC | PRN
  Administered 2016-10-08: 100 mg via INTRAVENOUS

## 2016-10-08 MED ORDER — SODIUM CHLORIDE 0.9 % IJ SOLN
INTRAMUSCULAR | Status: AC
  Filled 2016-10-08: qty 50

## 2016-10-08 MED ORDER — MORPHINE SULFATE (PF) 2 MG/ML IV SOLN: 2.0000 mg | INTRAVENOUS | Status: DC | PRN

## 2016-10-08 MED ORDER — FENTANYL CITRATE (PF) 100 MCG/2ML IJ SOLN
INTRAMUSCULAR | Status: DC | PRN
  Administered 2016-10-08: 25 ug via INTRAVENOUS
  Administered 2016-10-08: 50 ug via INTRAVENOUS
  Administered 2016-10-08: 25 ug via INTRAVENOUS
  Administered 2016-10-08: 100 ug via INTRAVENOUS

## 2016-10-08 MED ORDER — OXYCODONE HCL 5 MG PO TABS: 10.0000 mg | ORAL_TABLET | ORAL | Status: DC | PRN

## 2016-10-08 MED ORDER — DEXAMETHASONE SODIUM PHOSPHATE 10 MG/ML IJ SOLN
INTRAMUSCULAR | Status: DC | PRN
  Administered 2016-10-08: 10 mg via INTRAVENOUS

## 2016-10-08 MED ORDER — DEXAMETHASONE SODIUM PHOSPHATE 10 MG/ML IJ SOLN
INTRAMUSCULAR | Status: AC
  Filled 2016-10-08: qty 1

## 2016-10-08 MED ORDER — HYDROMORPHONE HCL 1 MG/ML IJ SOLN
0.2500 mg | INTRAMUSCULAR | Status: DC | PRN
Start: 2016-10-08 — End: 2016-10-08
  Administered 2016-10-08 (×2): 0.5 mg via INTRAVENOUS

## 2016-10-08 MED ORDER — SUCCINYLCHOLINE CHLORIDE 200 MG/10ML IV SOSY
PREFILLED_SYRINGE | INTRAVENOUS | Status: DC | PRN
  Administered 2016-10-08: 140 mg via INTRAVENOUS

## 2016-10-08 MED ORDER — EPHEDRINE SULFATE 50 MG/ML IJ SOLN
INTRAMUSCULAR | Status: DC | PRN
  Administered 2016-10-08: 5 mg via INTRAVENOUS

## 2016-10-08 MED ORDER — PROPOFOL 10 MG/ML IV BOLUS
INTRAVENOUS | Status: DC | PRN
  Administered 2016-10-08: 160 mg via INTRAVENOUS

## 2016-10-08 SURGICAL SUPPLY — 65 items
BAG LAPAROSCOPIC 12 15 PORT 16 (BASKET) ×1 IMPLANT
BAG RETRIEVAL 12/15 (BASKET) ×2
BAG RETRIEVAL 12/15MM (BASKET) ×1
CHLORAPREP W/TINT 26ML (MISCELLANEOUS) ×3 IMPLANT
CLIP LIGATING HEM O LOK PURPLE (MISCELLANEOUS) ×3 IMPLANT
CLIP LIGATING HEMO LOK XL GOLD (MISCELLANEOUS) ×15 IMPLANT
CLIP LIGATING HEMO O LOK GREEN (MISCELLANEOUS) ×3 IMPLANT
COVER SURGICAL LIGHT HANDLE (MISCELLANEOUS) ×3 IMPLANT
COVER TIP SHEARS 8 DVNC (MISCELLANEOUS) ×1 IMPLANT
COVER TIP SHEARS 8MM DA VINCI (MISCELLANEOUS) ×2
CUTTER ECHEON FLEX ENDO 45 340 (ENDOMECHANICALS) ×3 IMPLANT
DECANTER SPIKE VIAL GLASS SM (MISCELLANEOUS) ×3 IMPLANT
DERMABOND ADVANCED (GAUZE/BANDAGES/DRESSINGS) ×4
DERMABOND ADVANCED .7 DNX12 (GAUZE/BANDAGES/DRESSINGS) ×2 IMPLANT
DRAIN CHANNEL 15F RND FF 3/16 (WOUND CARE) ×3 IMPLANT
DRAPE ARM DVNC X/XI (DISPOSABLE) ×4 IMPLANT
DRAPE COLUMN DVNC XI (DISPOSABLE) ×1 IMPLANT
DRAPE DA VINCI XI ARM (DISPOSABLE) ×8
DRAPE DA VINCI XI COLUMN (DISPOSABLE) ×2
DRAPE INCISE IOBAN 66X45 STRL (DRAPES) ×3 IMPLANT
DRAPE SHEET LG 3/4 BI-LAMINATE (DRAPES) ×3 IMPLANT
DRSG TEGADERM 8X12 (GAUZE/BANDAGES/DRESSINGS) ×3 IMPLANT
ELECT PENCIL ROCKER SW 15FT (MISCELLANEOUS) ×3 IMPLANT
ELECT REM PT RETURN 9FT ADLT (ELECTROSURGICAL) ×3
ELECTRODE REM PT RTRN 9FT ADLT (ELECTROSURGICAL) ×1 IMPLANT
EVACUATOR SILICONE 100CC (DRAIN) ×3 IMPLANT
GLOVE BIO SURGEON STRL SZ 6.5 (GLOVE) ×2 IMPLANT
GLOVE BIO SURGEONS STRL SZ 6.5 (GLOVE) ×1
GLOVE BIOGEL M STRL SZ7.5 (GLOVE) ×12 IMPLANT
GOWN STRL REUS W/TWL LRG LVL3 (GOWN DISPOSABLE) ×18 IMPLANT
IRRIG SUCT STRYKERFLOW 2 WTIP (MISCELLANEOUS)
IRRIGATION SUCT STRKRFLW 2 WTP (MISCELLANEOUS) IMPLANT
KIT BASIN OR (CUSTOM PROCEDURE TRAY) ×3 IMPLANT
LOOP MINI RED (MISCELLANEOUS) ×3 IMPLANT
MARKER SKIN DUAL TIP RULER LAB (MISCELLANEOUS) ×3 IMPLANT
NEEDLE INSUFFLATION 14GA 120MM (NEEDLE) ×3 IMPLANT
NS IRRIG 1000ML POUR BTL (IV SOLUTION) IMPLANT
PORT ACCESS TROCAR AIRSEAL 12 (TROCAR) ×1 IMPLANT
PORT ACCESS TROCAR AIRSEAL 5M (TROCAR) ×2
POSITIONER SURGICAL ARM (MISCELLANEOUS) ×6 IMPLANT
RELOAD STAPLER WHITE 60MM (STAPLE) IMPLANT
RELOAD WH ECHELON 45 (STAPLE) ×24 IMPLANT
SEAL CANN UNIV 5-8 DVNC XI (MISCELLANEOUS) ×4 IMPLANT
SEAL XI 5MM-8MM UNIVERSAL (MISCELLANEOUS) ×8
SET TRI-LUMEN FLTR TB AIRSEAL (TUBING) ×3 IMPLANT
SOLUTION ELECTROLUBE (MISCELLANEOUS) ×3 IMPLANT
STAPLE ECHEON FLEX 60 POW ENDO (STAPLE) IMPLANT
STAPLER RELOAD WHITE 60MM (STAPLE)
SUT ETHILON 3 0 PS 1 (SUTURE) ×3 IMPLANT
SUT MNCRL AB 4-0 PS2 18 (SUTURE) ×6 IMPLANT
SUT PDS AB 1 CT1 27 (SUTURE) ×9 IMPLANT
SUT VIC AB 2-0 SH 27 (SUTURE) ×2
SUT VIC AB 2-0 SH 27X BRD (SUTURE) ×1 IMPLANT
SUT VLOC BARB 180 ABS3/0GR12 (SUTURE) ×3
SUTURE VLOC BRB 180 ABS3/0GR12 (SUTURE) ×1 IMPLANT
TAPE STRIPS DRAPE STRL (GAUZE/BANDAGES/DRESSINGS) ×3 IMPLANT
TOWEL OR 17X26 10 PK STRL BLUE (TOWEL DISPOSABLE) ×3 IMPLANT
TOWEL OR NON WOVEN STRL DISP B (DISPOSABLE) ×3 IMPLANT
TRAY FOLEY W/METER SILVER 16FR (SET/KITS/TRAYS/PACK) ×3 IMPLANT
TRAY LAPAROSCOPIC (CUSTOM PROCEDURE TRAY) ×3 IMPLANT
TROCAR BLADELESS OPT 12M 100M (ENDOMECHANICALS) ×3 IMPLANT
TROCAR BLADELESS OPT 5 100 (ENDOMECHANICALS) ×3 IMPLANT
TROCAR XCEL 12X100 BLDLESS (ENDOMECHANICALS) ×3 IMPLANT
WATER STERILE IRR 1000ML POUR (IV SOLUTION) ×3 IMPLANT
WATER STERILE IRR 1500ML POUR (IV SOLUTION) IMPLANT

## 2016-10-08 NOTE — Brief Op Note (Signed)
10/08/2016  6:11 PM  PATIENT:  Nathaniel Ponce  83 y.o. male  PRE-OPERATIVE DIAGNOSIS:  RIGHT RENAL PELVIS CANCER  POST-OPERATIVE DIAGNOSIS:  RIGHT RENAL PELVIS CANCER, LIVER NODULE  PROCEDURE:  Procedure(s): XI ROBOT ASSITED LAPAROSCOPIC NEPHROURETERECTOMY with liver biopsy (Right)  SURGEON:  Surgeon(s) and Role:    * Alexis Frock, MD - Primary  PHYSICIAN ASSISTANT:   ASSISTANTS: Debbrah Alar PA   ANESTHESIA:   general  EBL:  Total I/O In: 2436 [I.V.:2100; Blood:336] Out: 250 [Urine:200; Blood:50]  BLOOD ADMINISTERED: 1 u pRBC prior to incision  DRAINS: 1 - JP to bulb, 2 - Foely to gravity   LOCAL MEDICATIONS USED:  MARCAINE     SPECIMEN:  Source of Specimen:  1 - Rt kidney + ureter + bladder cuff, 2 - Liver nodule biopsy  DISPOSITION OF SPECIMEN:  PATHOLOGY  COUNTS:  YES  TOURNIQUET:  * No tourniquets in log *  DICTATION: .Other Dictation: Dictation Number [id number]  PLAN OF CARE: Admit to inpatient   PATIENT DISPOSITION:  PACU - hemodynamically stable.   Delay start of Pharmacological VTE agent (>24hrs) due to surgical blood loss or risk of bleeding: yes

## 2016-10-08 NOTE — Anesthesia Procedure Notes (Signed)
Procedure Name: Intubation Date/Time: 10/08/2016 2:25 PM Performed by: Lind Covert Pre-anesthesia Checklist: Patient identified, Emergency Drugs available, Suction available, Timeout performed and Patient being monitored Patient Re-evaluated:Patient Re-evaluated prior to inductionOxygen Delivery Method: Circle system utilized Preoxygenation: Pre-oxygenation with 100% oxygen Intubation Type: IV induction Laryngoscope Size: Mac and 4 Grade View: Grade I Tube type: Oral Tube size: 7.5 mm Airway Equipment and Method: Stylet Secured at: 23 cm Tube secured with: Tape Dental Injury: Teeth and Oropharynx as per pre-operative assessment

## 2016-10-08 NOTE — Anesthesia Postprocedure Evaluation (Signed)
Anesthesia Post Note  Patient: Nathaniel Ponce  Procedure(s) Performed: Procedure(s) (LRB): XI ROBOT ASSITED LAPAROSCOPIC NEPHROURETERECTOMY with liver biopsy (Right)  Patient location during evaluation: PACU Anesthesia Type: General Level of consciousness: awake and alert Pain management: pain level controlled Vital Signs Assessment: post-procedure vital signs reviewed and stable Respiratory status: spontaneous breathing, nonlabored ventilation, respiratory function stable and patient connected to nasal cannula oxygen Cardiovascular status: blood pressure returned to baseline and stable Postop Assessment: no signs of nausea or vomiting Anesthetic complications: no       Last Vitals:  Vitals:   10/08/16 1959 10/08/16 2013  BP: (!) 164/74 (!) 157/78  Pulse: 64 65  Resp: 13 18  Temp:  36.7 C    Last Pain:  Vitals:   10/08/16 1959  TempSrc:   PainSc: Asleep                 Catalina Gravel

## 2016-10-08 NOTE — Discharge Summary (Signed)
Date of admission: 10/08/2016  Date of discharge: 10/10/2016  Admission diagnosis: Right Upper tract urothelial Cancer  Discharge diagnosis: Right Upper tract urothelial Cancer  History and Physical: For full details, please see admission history and physical. Briefly, Nathaniel Ponce is a 83 y.o. gentleman with high volume TCC of right renal pelvis.  After discussing management/treatment options, he elected to proceed with surgical treatment.  Hospital Course: Nathaniel Ponce was taken to the operating room on 10/08/2016 and underwent a robotic assisted laparoscopic right nephroureterectomy. He tolerated this procedure well and without complications. Postoperatively, he was able to be transferred to a regular hospital room following recovery from anesthesia.  He was able to begin ambulating the night of surgery. He remained hemodynamically stable overnight.  He had excellent urine output with appropriately minimal output from his pelvic drain and his pelvic drain was removed on POD #1.  He was transitioned to oral pain medication, tolerated a regular diet, and had met all discharge criteria and was able to be discharged home later on POD#2.  Laboratory values:   Recent Labs  10/08/16 1930 10/09/16 0516  HGB 8.7* 8.9*  HCT 28.5* 28.8*    Disposition: Home  Discharge instruction: He was instructed to be ambulatory but to refrain from heavy lifting, strenuous activity, or driving. He was instructed on urethral catheter care.  Discharge medications:   Allergies as of 10/10/2016   No Known Allergies     Medication List    STOP taking these medications   aspirin 81 MG tablet     TAKE these medications   atorvastatin 20 MG tablet Commonly known as:  LIPITOR Take 20 mg by mouth daily at 6 PM.   cephALEXin 500 MG capsule Commonly known as:  KEFLEX Take 1 capsule (500 mg total) by mouth 3 (three) times daily. Start day prior to catheter removal. Start taking on:  10/15/2016   diltiazem 180  MG 24 hr capsule Commonly known as:  DILACOR XR Take 180 mg by mouth daily.   finasteride 5 MG tablet Commonly known as:  PROSCAR Take 5 mg by mouth daily.   fluticasone 50 MCG/ACT nasal spray Commonly known as:  FLONASE Place 2 sprays into the nose daily as needed for allergies (runny nose).   multivitamin capsule Take 1 capsule by mouth daily. Centrum silver   oxyCODONE-acetaminophen 5-325 MG tablet Commonly known as:  ROXICET Take 1-2 tablets by mouth every 4 (four) hours as needed for severe pain.   polyethylene glycol packet Commonly known as:  MIRALAX / GLYCOLAX Take 17 g by mouth daily.   senna-docusate 8.6-50 MG tablet Commonly known as:  Senokot-S Take 1 tablet by mouth 2 (two) times daily. While taking strong pain meds to prevent constipation.       Followup: He will followup in 1 week for catheter removal and to discuss his surgical pathology results.

## 2016-10-08 NOTE — Anesthesia Preprocedure Evaluation (Addendum)
Anesthesia Evaluation  Patient identified by MRN, date of birth, ID band Patient awake    Reviewed: Allergy & Precautions, NPO status , Patient's Chart, lab work & pertinent test results  History of Anesthesia Complications (+) PONV and history of anesthetic complications  Airway Mallampati: II  TM Distance: >3 FB Neck ROM: Full    Dental  (+) Caps   Pulmonary    Pulmonary exam normal breath sounds clear to auscultation       Cardiovascular hypertension, Pt. on medications (-) angina+ CAD, + Past MI (NSTEMI 7/17) and + CABG (7/17, preserved LVF with EF 65%)  Normal cardiovascular exam+ dysrhythmias (converted with Diltiazem) Atrial Fibrillation  Rhythm:Regular Rate:Normal     Neuro/Psych    GI/Hepatic   Endo/Other  Hyperlipidemia  Renal/GU Renal InsufficiencyRenal diseaseCreat 1.41   Bladder cancer with hematuria    Musculoskeletal   Abdominal (+) + obese,   Peds  Hematology  (+) Blood dyscrasia (Hb 8.1), anemia ,   Anesthesia Other Findings   Reproductive/Obstetrics                          Lab Results  Component Value Date   WBC 7.5 10/02/2016   HGB 8.1 (L) 10/02/2016   HCT 27.3 (L) 10/02/2016   MCV 70.5 (L) 10/02/2016   PLT 404 (H) 10/02/2016     Chemistry      Component Value Date/Time   NA 139 10/02/2016 1010   K 4.3 10/02/2016 1010   CL 106 10/02/2016 1010   CO2 26 10/02/2016 1010   BUN 19 10/02/2016 1010   CREATININE 1.41 (H) 10/02/2016 1010      Component Value Date/Time   CALCIUM 9.9 10/02/2016 1010   ALKPHOS 67 12/22/2014 0906   AST 23 12/22/2014 0906   ALT 26 12/22/2014 0906   BILITOT 0.5 12/22/2014 0906     EKG: normal sinus rhythm.  Anesthesia Physical Anesthesia Plan  ASA: III  Anesthesia Plan: General   Post-op Pain Management:    Induction: Intravenous  Airway Management Planned: Oral ETT  Additional Equipment:   Intra-op Plan:    Post-operative Plan: Extubation in OR  Informed Consent: I have reviewed the patients History and Physical, chart, labs and discussed the procedure including the risks, benefits and alternatives for the proposed anesthesia with the patient or authorized representative who has indicated his/her understanding and acceptance.   Dental advisory given  Plan Discussed with: Anesthesiologist, CRNA and Surgeon  Anesthesia Plan Comments:         Anesthesia Quick Evaluation

## 2016-10-08 NOTE — H&P (Signed)
Nathaniel Ponce is an 83 y.o. male.    Chief Complaint: Pre-op RIGHT Nephro-Ureterectomy  HPI:   1 - Recurrent Bladder Cancer - long h/o non-muscle invasive bladder cancer since 2009. Most recent surveillance cystos negative. He has been very compliant with follow up.   2 - Enlarged Prostate with Weak Urinary Stream - s/p prior TURP at [facility], now on finasteride since 08/2015.   3 - Gross Hematuria / Large RIght Renal Pelvis Cancer - new gross hematuria and irritative voiding 08/2016. H/o bladder cancer as per above. UCX negative and cysto negative last visit. CT from [facility] with new moderate right hydro and blood density material in collecting system without stone that was confirmed large volume urothelial carcinoma by operative ureteroswcopy / stent 08/2016. Although mostly low grade, volume of disease and infiltrative nature NOT amenable to endoscopic management. Baseline Cr 1.41.   PMH sig for HTN, CAD/CABG (no strong blood thinners). His PCP is Dr. Marveen Reeks in [place]. His daughter is RN with [employer] and very involved in his management.    Today " Nathaniel Ponce " is seen to proceed with RIGHT robotic nephr-ureterectomy for his large volume renal pelvis cancer that is clinically localized but continues to cause hematuria with anemia. Most recent Hgb 8.1, no new CP/SOB.    Past Medical History:  Diagnosis Date  . Anemia   . Cancer [facility]) 2009   bladder cancer  . Coronary artery disease    CABG 04/2016  . Dyslipidemia   . Dyslipidemia   . Dysrhythmia   . Hematuria   . Hemorrhoids   . History of bladder infections   . Hypertension   . Myocardial infarction 04/2016  . Neoplasm of bladder 2009  . Neoplasm of urethra   . Nocturia   . Obesity   . PONV (postoperative nausea and vomiting)   . Urinary frequency   . Wears glasses     Past Surgical History:  Procedure Laterality Date  . BLADDER SURGERY     TURBT-multiple last 05/22/2014  . CARDIAC CATHETERIZATION    . CORONARY ARTERY BYPASS  GRAFT  04/14/2016   at [facility] (dr Lincoln Brigham)   x3 grafts-- LIMA to LAD,  SVG to PDA,  SVG to OM  . CYSTOSCOPY    . CYSTOSCOPY WITH STENT PLACEMENT Right 09/03/2016   Procedure: CYSTOSCOPY WITH STENT PLACEMENT;  Surgeon: Alexis Frock, MD;  Location: [facility];  Service: Urology;  Laterality: Right;  . CYSTOSCOPY/RETROGRADE/URETEROSCOPY Bilateral 09/03/2016   Procedure: CYSTOSCOPY/RETROGRADE/RIGHT DIAGNOSTIC URETEROSCOPY;  Surgeon: Alexis Frock, MD;  Location: [facility];  Service: Urology;  Laterality: Bilateral;  . HEMORROIDECTOMY      No family history on file. Social History:  reports that he has never smoked. His smokeless tobacco use includes Chew and Snuff. He reports that he does not drink alcohol or use drugs.  Allergies: No Known Allergies  No prescriptions prior to admission.    No results found for this or any previous visit (from the past 48 hour(s)). No results found.  Review of Systems  Constitutional: Negative.  Negative for chills and fever.  HENT: Negative.   Eyes: Negative.   Respiratory: Negative.   Cardiovascular: Negative.   Gastrointestinal: Negative.   Genitourinary: Positive for hematuria.  Musculoskeletal: Negative.   Skin: Negative.   Neurological: Negative.   Endo/Heme/Allergies: Negative.   Psychiatric/Behavioral: Negative.     There were no vitals taken for this visit. Physical Exam  Constitutional: He appears well-developed.  HENT:  Head: Normocephalic.  Eyes: Pupils are equal, round, and reactive to light.  Neck: Normal range of motion.  Cardiovascular: Normal rate.   Prior sternotomy scar  Respiratory: Effort normal.  GI: Soft.  Genitourinary:  Genitourinary Comments: NO CVAT.   Musculoskeletal: Normal range of motion.  Neurological: He is alert.  Skin: Skin is warm.  Psychiatric: He has a normal mood and affect. His behavior is normal. Judgment and thought content normal.      Assessment/Plan  Proceed as planned with RIGHT robotic nephro-ureterectomy. Risks, benefits, alternatives, expected peri-op course discussed previously and reiterated today.   He has JJ stent in situ on right.   1upRBC now, low threshold for additional blood pending peri-op course.   Alexis Frock, MD 10/08/2016, 7:16 AM

## 2016-10-08 NOTE — Transfer of Care (Signed)
Immediate Anesthesia Transfer of Care Note  Patient: Nathaniel Ponce  Procedure(s) Performed: Procedure(s): XI ROBOT ASSITED LAPAROSCOPIC NEPHROURETERECTOMY with liver biopsy (Right)  Patient Location: PACU  Anesthesia Type:General  Level of Consciousness: awake, alert  and oriented  Airway & Oxygen Therapy: Patient Spontanous Breathing and Patient connected to face mask oxygen  Post-op Assessment: Report given to RN and Post -op Vital signs reviewed and stable  Post vital signs: Reviewed and stable  Last Vitals:  Vitals:   10/08/16 1113  BP: 130/75  Pulse: 93  Resp: 20  Temp: 36.9 C    Last Pain:  Vitals:   10/08/16 1113  TempSrc: Oral      Patients Stated Pain Goal: 3 ([id number])  Complications: No apparent anesthesia complications

## 2016-10-09 ENCOUNTER — Encounter (HOSPITAL_COMMUNITY): Payer: Self-pay | Admitting: Urology

## 2016-10-09 LAB — BASIC METABOLIC PANEL
ANION GAP: 10 (ref 5–15)
BUN: 18 mg/dL (ref 6–20)
CALCIUM: 9 mg/dL (ref 8.9–10.3)
CO2: 23 mmol/L (ref 22–32)
Chloride: 103 mmol/L (ref 101–111)
Creatinine, Ser: 1.43 mg/dL — ABNORMAL HIGH (ref 0.61–1.24)
GFR calc Af Amer: 52 mL/min — ABNORMAL LOW (ref >60)
GFR, EST NON AFRICAN AMERICAN: 45 mL/min — AB (ref >60)
Glucose, Bld: 151 mg/dL — ABNORMAL HIGH (ref 65–99)
POTASSIUM: 4.9 mmol/L (ref 3.5–5.1)
SODIUM: 136 mmol/L (ref 135–145)

## 2016-10-09 LAB — HEMOGLOBIN AND HEMATOCRIT, BLOOD
HCT: 28.8 % — ABNORMAL LOW (ref 39.0–52.0)
HEMOGLOBIN: 8.9 g/dL — AB (ref 13.0–17.0)

## 2016-10-09 MED ORDER — HYDRALAZINE HCL 20 MG/ML IJ SOLN: 10.0000 mg | Freq: Four times a day (QID) | INTRAMUSCULAR | Status: DC | PRN

## 2016-10-09 MED ORDER — CEPHALEXIN 500 MG PO CAPS: 500.0000 mg | ORAL_CAPSULE | Freq: Three times a day (TID) | ORAL | 0 refills | Status: AC

## 2016-10-09 MED ORDER — GUAIFENESIN ER 600 MG PO TB12: 600.0000 mg | ORAL_TABLET | Freq: Two times a day (BID) | ORAL | Status: DC | PRN

## 2016-10-09 NOTE — Progress Notes (Signed)
Initial Nutrition Assessment  DOCUMENTATION CODES:   Obesity unspecified  INTERVENTION:  - Continue Boost Breeze TID, each supplement provides 250 kcal and 9 grams of protein - Continue to encourage PO intakes of meals and supplements.  - RD will continue to monitor for needs if pt unable to d/c tomorrow.   NUTRITION DIAGNOSIS:   Inadequate oral intake related to poor appetite as evidenced by per patient/family report.  GOAL:   Patient will meet greater than or equal to 90% of their needs  MONITOR:   PO intake, Supplement acceptance, Weight trends, Labs, I & O's  REASON FOR ASSESSMENT:   Malnutrition Screening Tool  ASSESSMENT:   83 year-old male with hx of durothelial carcinoma of R kidney s/p robotic-assisted laparoscopic right nephroureterectomy on 10/08/16.  Pt seen for MST. BMI indicates obesity. No intakes documented since admission. Pt had a large breakfast consisting of oatmeal, toast, bacon, eggs, and coffee. Lunch has already been ordered but has not arrived and is [place], sweet potato casserole, green beans, and sweet tea. Pt, wife, and two daughters provide information. Pt had CABG in July and since that time has experienced lack of taste which has led to decreased appetite and decreased desire to eat. Over the past few days taste has begun to return and appetite has increased. PTA intakes were highly variably. Some days pt would eat large meals (such as those ordered today) and other days he would snack around meal times.   Pt and family report 20 lb weight loss since July. Based on CBW, this indicates 8.5% body weight loss in 5-6 months which is not significant for time frame. No muscle or fat wasting noted during assessment.   Pt enjoys [employer] and he and family are interested in continuing this supplement or a similar item on an outpatient basis. Dr. Zettie Pho note from this AM indicates plan to d/c 10/10/16.  Medications reviewed; 100 mg Colace BID, PRN IV Zofran,  1 tablet Senokot BID.  Labs reviewed; creatinine: 1.43 mg/dL, GFR: 45 mL/min.     Diet Order:  Diet regular Room service appropriate? Yes; Fluid consistency: Thin  Skin:  Reviewed, no issues  Last BM:  PTA  Height:   Ht Readings from Last 1 Encounters:  10/08/16 5\' 9"  (1.753 m)    Weight:   Wt Readings from Last 1 Encounters:  10/08/16 214 lb 8.1 oz (97.3 kg)    Ideal Body Weight:  72.73 kg  BMI:  Body mass index is 31.68 kg/m.  Estimated Nutritional Needs:   Kcal:  1945-2140 (20-22 kcal/kg)  Protein:  100-115 grams  Fluid:  >/= 2 L/day  EDUCATION NEEDS:   No education needs identified at this time    Nathaniel Matin, MS, RD, LDN, CNSC Inpatient Clinical Dietitian Pager # [phone] After hours/weekend pager # [phone]

## 2016-10-09 NOTE — Progress Notes (Signed)
Patient ambulated in hallway. Patient states that he felt good walking with only minimal pain.

## 2016-10-09 NOTE — Progress Notes (Signed)
JP drain removed, pt tolerated well. Dressing applied, clean dry and intact

## 2016-10-09 NOTE — Op Note (Signed)
NAME:  TAYYAB, RAPP NO.:  MEDICAL RECORD NO.:  [id number]  LOCATION:                                 FACILITY:  PHYSICIAN:  Alexis Frock, MD          DATE OF BIRTH:  DATE OF PROCEDURE: 10/08/2016                              OPERATIVE REPORT   DIAGNOSIS:  Infiltrative right renal pelvis mass with recurrent hematuria and anemia.  PROCEDURE: 1. Robotic-assisted laparoscopic right radical nephroureterectomy. 2. Laparoscopic liver biopsy.  SURGEON:  Alexis Frock, M.D.  ESTIMATED BLOOD LOSS:  Nil.  DRAINS: 1. Jackson-Pratt drain to bulb suction. 2. Foley catheter to straight drain.  ASSISTANT:  Clemetine Marker, PA.  FINDINGS: 1. One artery one vein right renovascular anatomy as anticipated. 2. Significant desmoplastic inflammatory reaction around the kidney,     concerning for possible locally advanced malignancy versus     inflammation from chronic obstruction. 3. Small, approximately 2 cm white liver nodule, given history of     malignancy this was biopsied.  INDICATION:  Mr. Coady is a very pleasant 83 year old gentleman with long history of localized bladder cancer.  He has been very compliant with surveillance for years.  He had a recent onset of gross hematuria with some clots.  Office cystoscopy was unremarkable.  He had a CT scan recently at referring facility that was independently reviewed and found to be very abnormal in the area of the right kidney, highly concerning for possible urothelial malignancy.  He underwent diagnostic ureteroscopy approximately 6 weeks ago, which did reveal large volume infiltrative right renal pelvis mass that was biopsied consistent with high-grade urothelial carcinoma.  This remained clinically localized. Options were discussed including palliative only treatment versus curative intent therapy with right nephroureterectomy and he adamantly wished to proceed with the latter.  Informed consent was  obtained and placed in the medical record.  Notably, the patient does have some preop anemia from his prolonged hematuria, with a starting hemoglobin of 8.1. He does have a history of vascular disease.  He has been admitted to give 1 unit of red blood cells preemptively prior to incision.  PROCEDURE IN DETAIL:  The patient being Khase Kappel identified. Procedure being right nephroureterectomy was confirmed.  Procedure was carried out.  Time-out was performed.  Intravenous antibiotics administered.  General endotracheal anesthesia introduced.  Foley catheter was placed per urethra to straight drain.  The patient was placed into a right side up full flank position, applying 15 degrees of stable flexion, superior arm elevator, axillary roll, sequential compression devices, bottom leg bent, top leg straight.  He was further fashioned to the operative table using 3-inch tape over foam padding across the supraxiphoid, abdomen, and pelvis.  Next sterile field was created by prepping and draping the patient's entire right flank and abdomen and a high-flow, low-pressure pneumoperitoneum was obtained using Veress technique in the right lower quadrant having passed the aspiration and drop test.  Next, robotic camera port was placed and positioned approximately 1/2 handbreadth superior medial to the umbilicus.  Laparoscopic examination of the peritoneal cavity revealed no significant adhesions and no visceral injury.  Additional ports were placed  as follows:  Right subcostal 8-mm robotic port, right far lateral 8-mm robotic port approximately 4 fingerbreadths superior medial to the anterior iliac spine.  Right paramedian inferior robotic port approximately 4 fingerbreadths superior to the pubic ramus purposely nearing towards the midline, and two 12-mm assistant ports 1 at a level 2 fingerbreadths above the camera port in the midline and another in the infraumbilical location in the midline, and  finally a 5-mm port in the subxiphoid midline location through which a self-locking grasper was used to place the liver edge in superior traction.  Robot was docked and passed through the electronic checks.  Inspection of the intraabdominal cavity did reveal a nodule of the inferior liver border, this was approximately 2 cm in size, this was not grossly adherent to surrounding structures, this was not obviously noted on prior CT scan.  This area was biopsied later.  Attention was then directed to retroperitoneum. Incision was made lateral to the ascending colon from the area of the cecum towards the area of the hepatic flexure along the ascending colon, carefully rotated medially.  Lower pole of the kidney was identified and placed on gentle lateral traction.  Duodenum was encountered and also kocherized very carefully medially.  There was significant desmoplastic reaction around, especially the anterior surface of Gerota's fascia was somewhat concerning for possible inflammation due to chronic obstruction versus locally advanced disease.  However there was no obvious focal or locally advanced disease to suggest non-progress of the procedure.  The ureter and gonadal were encountered, the ureter was placed on gentle lateral traction at the level of the inferior aspect of the kidney and dissection proceeded within this triangle towards the area of the renal hilum.  Renal hilum consisted of single artery, single vein, renovascular anatomy as anticipated.  The artery was controlled using extra large Hem-O-Lok clip proximally, followed by vascular load stapler distally and the vein controlled with a separate vascular load stapler. Additional vascular staple loads were used just medial to the adrenal but lateral to the inferior vena cava, thus performing right adrenalectomy as well.  Superior and lateral attachments were taken down with cautery scissors.  At this point the kidney was completely  freed up and mobilized, adhered only by the right ureter.  Vessel loop was placed onto this and circumferential very careful mobilization was performed toward the area of the gonadal vessel on the right side.  These were very carefully controlled using vascular stapler and the posterior peritoneal window was obtained, following the course of the right medial umbilical ligament, which provided the floor of ureteral dissection. Ureters were also additional carefully mobilized circumferentially towards the area of the ureterovesical junction.  The right iliac vessels were perspectively identified.  Exquisite care was taken to avoid injury to these structures.  The right superior vesical artery was identified and dissection proceeded inferiorly towards the area of the ureterovesical junction, in this location, an extra large Hem-O-Lok clip was placed across the ureter to avoid displacement of stent and stay suture of 3-0 V-Loc was applied at the lateral most aspect of the bladder approximately 5 mm away from the ureterovesical junction and formal bladder cuff was developed using cautery scissors, keeping a several millimeter area around the area of ureteral orifice.  The previous V-Loc suture was then used to oversew the area of the bladder cuff x2, which resulted in excellent hemostasis and complete resolution of any visible bladder defect.  Specimen was then placed into an extra- large endoscopic retrieval bag.  Attention was then directed at biopsy of the small liver lesion using cautery scissors at maximum monopolar energy.  A small wedge resection of the mass was performed, excising approximately 6 mm wedge of the area and this was set aside as a separate permanent pathologic specimen.  The base of this was fulgurated, which resulted in complete hemostasis of the biopsy area. The liver retractor was taken down, inspected and remained completely hemostatic.  Closed suction drain was  brought to the previous lateral most robotic port site near the peritoneal cavity.  Robot was then undocked.  Specimen was retrieved by extending the previous inferior most assistant port site superiorly for distance of approximately 7 cm removing the large right nephroureterectomy, specimen of bladder cuff en block which was set aside for permanent pathology.  The extraction site was closed at the level of the fascia using figure-of-eight PDS x7. Scarpa was reapproximated with running Vicryl.  All incision sites were infiltrated with dilute lyophilized Marcaine.  The superior most assistant port site was closed at the level of the fascia using 0 Vicryl on UR needle.  All incision sites were then closed at the level of the skin using subcuticular Monocryl, followed by Dermabond.  Drain stitch was applied and procedure terminated.  The patient tolerated the procedure well.  There were no immediate periprocedural complications. The patient was taken to the postanesthesia care in stable condition.  Please note, assistant Clemetine Marker, was absolutely critical for all portions of procedure, providing valuable retraction, passage of instruments back and forth, suctioning and stapling, acting as bedside assistant for the procedure.          ______________________________ Alexis Frock, MD     TM/MEDQ  D:  10/08/2016  T:  10/09/2016  Job:  [id number]

## 2016-10-09 NOTE — Progress Notes (Signed)
NT attempted to get patient oob. Pt declined at this time, will follow up

## 2016-10-09 NOTE — Progress Notes (Signed)
1 Day Post-Op Subjective: The patient is doing well.  No nausea or vomiting. Pain is adequately controlled. Excellent UOP (1.3L) with 167ml from drain. Has not ambulated yet. No passage of flatus, no BM. Hb 8.9 from 8.7. Creatinine stable at 1.43 from 1.6 pre-op.  Objective: Vital signs in last 24 hours: Temp:  [97.6 F (36.4 C)-98.9 F (37.2 C)] 98.3 F (36.8 C) (01/04 0536) Pulse Rate:  [64-93] 65 (01/04 0536) Resp:  [12-20] 18 (01/04 0536) BP: (130-187)/(73-94) 174/79 (01/04 0536) SpO2:  [99 %-100 %] 100 % (01/04 0536) Weight:  [97.3 kg (214 lb 8.1 oz)-99.8 kg (220 lb)] 97.3 kg (214 lb 8.1 oz) (01/03 2021)  Intake/Output from previous day: 01/03 0701 - 01/04 0700 In: 5256 [P.O.:420; I.V.:4500; Blood:336] Out: [id number] [Urine:1325; Drains:120; Blood:50] Intake/Output this shift: Total I/O In: [id number] [P.O.:420; I.V.:1300] Out: 1245 [Urine:1125; Drains:120]  Physical Exam:  General: Alert and oriented. CV: RRR Lungs: normal work of breathing on 2L [place] GI: Soft, Nondistended. Incisions: Clean, dry, and intact. JP with small SS fluid Urine: Clear Extremities: Nontender, no erythema, no edema.  Lab Results:  Recent Labs  10/08/16 1930 10/09/16 0516  HGB 8.7* 8.9*  HCT 28.5* 28.8*      Assessment/Plan: POD# 1 s/p robotic-assisted laparoscopic right nephroureterectomy  1) SL IVF 2) Ambulate, Incentive spirometry 3) Transition to oral pain medication 4) Will add hydralazine for BP >170 PRN 5) D/C pelvic drain prior to discharge 6) Plan for likely discharge tomorrow with cystogram in one week   I have seen and examined the pateint and agree with abvoe.  Briefly, S: POD 1 s/p RIGHT nephroureterectomy and liver biopsy.  O: NAD, pain controlled, family at bedside JP scant serosanguinous discharge. Surgical sites c/d/i Foley with clear urine. No C/c/e Non-labored breathing on minimal [place] O2 Regualr heart rate  Labs as per above.  A/P: Ambulate, likely DC home  tomorrow.

## 2016-10-10 NOTE — Discharge Summary (Signed)
Physician Discharge Summary  Patient ID: Nathaniel Ponce MRN: [id number] DOB/AGE: July 02, 1934 83 y.o.  Admit date: 10/08/2016 Discharge date: 10/10/2016  Admission Diagnoses: Right Renal Pelvis Cancer  Discharge Diagnoses:  Active Problems:   Urothelial carcinoma of kidney, right [facility])   Discharged Condition: good  Hospital Course:   1 - Right Renal Pelvis Cancer - pt underwent RIGHT robotic nephroureterectomy on 10/08/16, the day of admission, without acute complications. He was given 1upRBC pre-op for Hgb 8.0 which rose to 8.9 post-op. JP removed POD 1 as output scant. By the AM of POD 2, the day or discharge, he is ambulatory, tolerating PO intake, pain controlled with PO meds and felt to be adequate for discharge. Final pathology pending at discharge. Cr 1.4, Hgb 8.9 at discharge.   Consults: None  Significant Diagnostic Studies: labs: as per above  Treatments: surgery: RIGHT robotic nephroureterectomy on 10/08/16  Discharge Exam: Blood pressure (!) 160/80, pulse 69, temperature 97.9 F (36.6 C), temperature source Oral, resp. rate 18, height 5\' 9"  (1.753 m), weight 97.3 kg (214 lb 8.1 oz), SpO2 96 %. General appearance: alert, cooperative, appears stated age and family at bedside Eyes: negative Nose: Nares normal. Septum midline. Mucosa normal. No drainage or sinus tenderness. Throat: lips, mucosa, and tongue normal; teeth and gums normal Neck: supple, symmetrical, trachea midline Back: symmetric, no curvature. ROM normal. No CVA tenderness. Resp: non-labored on room air.  Cardio: Nl rate GI: soft, non-tender; bowel sounds normal; no masses,  no organomegaly Male genitalia: normal, foley in place with clear yellow urine.  Extremities: extremities normal, atraumatic, no cyanosis or edema Skin: Skin color, texture, turgor normal. No rashes or lesions Neurologic: Grossly normal Incision/Wound: recent incision sites c/d/i. No hernias.   Disposition: 01-Home or Self Care  Discharge  Instructions    Discharge instructions    Complete by:  As directed    Activity:  You are encouraged to ambulate frequently (about every hour during waking hours) to help prevent blood clots from forming in your legs or lungs.  However, you should not engage in any heavy lifting (> 10-15 lbs), strenuous activity, or straining. Diet: You should continue a clear liquid diet until passing gas from below.  Once this occurs, you may advance your diet to a soft diet that would be easy to digest (i.e soups, scrambled eggs, mashed potatoes, etc.) for 24 hours just as you would if getting over a bad stomach flu.  If tolerating this diet well for 24 hours, you may then begin eating regular food.  It will be normal to have some amount of bloating, nausea, and abdominal discomfort intermittently. Prescriptions:  Resume aspirin in one week. You will be provided a prescription for pain medication to take as needed.  If your pain is not severe enough to require the prescription pain medication, you may take extra strength Tylenol instead.  You should also take an over the counter stool softener (Colace 100 mg twice daily) to avoid straining with bowel movements as the pain medication may constipate you. Finally, you will also be provided a prescription for an antibiotic to begin the day prior to your return visit in the office for catheter removal. Catheter care: You will be taught how to take care of the catheter by the nursing staff prior to discharge from the hospital.  You may use both a leg bag and the larger bedside bag but it is recommended to at least use the bigger bedside bag at nighttime as the leg bag  is small and will fill up overnight and also does not drain as well when lying flat. You may periodically feel a strong urge to void with the catheter in place.  This is a bladder spasm and most often can occur when having a bowel movement or when you are moving around. It is typically self-limited and usually will  stop after a few minutes.  You may use some Vaseline or Neosporin around the tip of the catheter to reduce friction at the tip of the penis. Incisions: You may remove your dressing bandages the 2nd day after surgery.  You most likely will have a few small staples in each of the incisions and once the bandages are removed, the incisions may stay open to air.  You may start showering (not soaking or bathing in water) 48 hours after surgery and the incisions simply need to be patted dry after the shower.  No additional care is needed. What to call us about: You should call the office [phone]) if you develop fever > 101, persistent vomiting, or the catheter stops draining. Also, feel free to call with any other questions you may have and remember the handout that was provided to you as a reference preoperatively which answers many of the common questions that arise after surgery.   Discharge patient    Complete by:  As directed    Discharge disposition:  01-Home or Self Care   Discharge patient date:  10/10/2016   Increase activity slowly    Complete by:  As directed      Allergies as of 10/10/2016   No Known Allergies     Medication List    STOP taking these medications   aspirin 81 MG tablet     TAKE these medications   atorvastatin 20 MG tablet Commonly known as:  LIPITOR Take 20 mg by mouth daily at 6 PM.   cephALEXin 500 MG capsule Commonly known as:  KEFLEX Take 1 capsule (500 mg total) by mouth 3 (three) times daily. Start day prior to catheter removal. Start taking on:  10/15/2016   diltiazem 180 MG 24 hr capsule Commonly known as:  DILACOR XR Take 180 mg by mouth daily.   finasteride 5 MG tablet Commonly known as:  PROSCAR Take 5 mg by mouth daily.   fluticasone 50 MCG/ACT nasal spray Commonly known as:  FLONASE Place 2 sprays into the nose daily as needed for allergies (runny nose).   multivitamin capsule Take 1 capsule by mouth daily. Centrum silver    oxyCODONE-acetaminophen 5-325 MG tablet Commonly known as:  ROXICET Take 1-2 tablets by mouth every 4 (four) hours as needed for severe pain.   polyethylene glycol packet Commonly known as:  MIRALAX / GLYCOLAX Take 17 g by mouth daily.   senna-docusate 8.6-50 MG tablet Commonly known as:  Senokot-S Take 1 tablet by mouth 2 (two) times daily. While taking strong pain meds to prevent constipation.      Follow-up Information    Alexis Frock, MD Follow up on 10/20/2016.   Specialty:  Urology Why:  at 10:30 for X-Ray, MD visit, and catheter removal.  Contact information: [place] [address] [phone]           Signed: Alexis Frock 10/10/2016, 7:29 AM

## 2016-10-10 NOTE — Discharge Instructions (Signed)

## 2016-10-12 LAB — TYPE AND SCREEN
ABO/RH(D): O POS
Antibody Screen: NEGATIVE
UNIT DIVISION: 0
UNIT DIVISION: 0
Unit division: 0

## 2016-10-23 ENCOUNTER — Telehealth: Payer: Self-pay | Admitting: Oncology

## 2016-10-23 ENCOUNTER — Encounter: Payer: Self-pay | Admitting: Oncology

## 2016-10-23 NOTE — Telephone Encounter (Signed)
Scheduled an appt with the pt's wife for 1/25 at 2pm w/Shadad. Pt agreed to the appt date and time. Aware to arrive 30 minuytes early. Demographics verified. Letter and directions mailed.

## 2016-10-30 ENCOUNTER — Telehealth: Payer: Self-pay | Admitting: Oncology

## 2016-10-30 ENCOUNTER — Ambulatory Visit (HOSPITAL_BASED_OUTPATIENT_CLINIC_OR_DEPARTMENT_OTHER): Payer: Medicare Other | Admitting: Oncology

## 2016-10-30 VITALS — BP 135/70 | HR 81 | Temp 98.2°F | Resp 18 | Ht 69.0 in | Wt 212.4 lb

## 2016-10-30 DIAGNOSIS — C641 Malignant neoplasm of right kidney, except renal pelvis: Secondary | ICD-10-CM | POA: Diagnosis not present

## 2016-10-30 DIAGNOSIS — D649 Anemia, unspecified: Secondary | ICD-10-CM | POA: Diagnosis not present

## 2016-10-30 DIAGNOSIS — N289 Disorder of kidney and ureter, unspecified: Secondary | ICD-10-CM

## 2016-10-30 DIAGNOSIS — C787 Secondary malignant neoplasm of liver and intrahepatic bile duct: Secondary | ICD-10-CM

## 2016-10-30 DIAGNOSIS — D494 Neoplasm of unspecified behavior of bladder: Secondary | ICD-10-CM

## 2016-10-30 DIAGNOSIS — Z8551 Personal history of malignant neoplasm of bladder: Secondary | ICD-10-CM

## 2016-10-30 DIAGNOSIS — F419 Anxiety disorder, unspecified: Secondary | ICD-10-CM

## 2016-10-30 DIAGNOSIS — G4700 Insomnia, unspecified: Secondary | ICD-10-CM

## 2016-10-30 NOTE — Progress Notes (Signed)
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   HPI: 83 year old Nathaniel Ponce native of [place] where he lived the majority of his life. Nathaniel Ponce with history of coronary artery disease as well as history of non-muscle invasive bladder cancer that was followed at [facility] until 2016. At that time established care with Dr. Tresa Moore. He had received the BCG induction and 2009 as well as Gemzar, BCG and interferon on previous occasions. He had received active surveillance including cystoscopies in May 2016 and in May 2017 which showed no evidence of disease. He started developing recurrent hematuria and found to have a renal pelvis mass and right hydronephrosis. He underwent cystoscopy and bilateral retrograde milligrams and right ureteroscopy and biopsy done on 09/03/2016. Biopsy of the renal pelvis showed a papillary urothelial carcinoma on 2 separate biopsies. Imaging studies did not show any evidence of metastatic disease. On 10/08/2016 he underwent robotic-assisted laparoscopic right radical nephroureterectomy was also noted to have a liver lesion that was laparoscopically biopsied. 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 Lymphovascular invasion and perineural invasion was identified. The liver biopsy showed also metastatic urothelial carcinoma. The final pathological staging was T4 NX M1. He tolerated the procedure well and fully recovered at this time.  Clinically he is having hard time adjusting to the diagnosis and the implications of that. He is having a lot of anxiety issues especially as of late with this coming appointment. He had difficulty sleeping although does not report any hematuria or dysuria. He does not report any back pain shoulder pain. He is ambulating without any difficulties. His exercise tolerance is improving  slowly.  He does not report any headaches, blurry vision, syncope or seizures. He does not report any fevers, chills, sweats but does report total of 20 pound weight loss. He does not report any chest pain, palpitation, orthopnea or leg edema. He does not report any cough, wheezing or hemoptysis. He does not report any nausea, vomiting or abdominal pain. He does not report constipation, diarrhea or hematochezia. He does not report any melena or early satiety. He does not report any frequency urgency or hesitancy. Remaining review of systems unremarkable.   Past Medical History:  Diagnosis Date  . Anemia   . Cancer [facility]) 2009   bladder cancer  . Coronary artery disease    CABG 04/2016  . Dyslipidemia   . Dyslipidemia   . Dysrhythmia   . Hematuria   . Hemorrhoids   . History of bladder infections   . Hypertension   . Myocardial infarction 04/2016  . Neoplasm of bladder 2009  . Neoplasm of urethra   . Nocturia   . Obesity   . PONV (postoperative nausea and vomiting)   . Urinary frequency   . Wears glasses   :  Past Surgical History:  Procedure Laterality Date  . BLADDER SURGERY     TURBT-multiple last 05/22/2014  . CARDIAC CATHETERIZATION    . CORONARY ARTERY BYPASS GRAFT  04/14/2016   at [place] (dr Lincoln Brigham)   x3 grafts-- LIMA to LAD,  SVG to PDA,  SVG to OM  . CYSTOSCOPY    . CYSTOSCOPY WITH STENT PLACEMENT Right 09/03/2016   Procedure: CYSTOSCOPY WITH STENT PLACEMENT;  Surgeon: Alexis Frock, MD;  Location: [facility];  Service: Urology;  Laterality: Right;  . CYSTOSCOPY/RETROGRADE/URETEROSCOPY Bilateral 09/03/2016   Procedure: CYSTOSCOPY/RETROGRADE/RIGHT DIAGNOSTIC URETEROSCOPY;  Surgeon: Alexis Frock,  MD;  Location: [facility];  Service: Urology;  Laterality: Bilateral;  . HEMORROIDECTOMY    . ROBOT ASSITED LAPAROSCOPIC NEPHROURETERECTOMY Right 10/08/2016   Procedure: XI ROBOT ASSITED LAPAROSCOPIC NEPHROURETERECTOMY with liver biopsy;  Surgeon:  Alexis Frock, MD;  Location: [facility];  Service: Urology;  Laterality: Right;  :   Current Outpatient Prescriptions:  .  atorvastatin (LIPITOR) 20 MG tablet, Take 20 mg by mouth daily at 6 PM., Disp: , Rfl:  .  diltiazem (DILACOR XR) 180 MG 24 hr capsule, Take 180 mg by mouth daily., Disp: , Rfl:  .  finasteride (PROSCAR) 5 MG tablet, Take 5 mg by mouth daily., Disp: , Rfl:  .  fluticasone (FLONASE) 50 MCG/ACT nasal spray, Place 2 sprays into the nose daily as needed for allergies (runny nose). , Disp: , Rfl:  .  oxyCODONE-acetaminophen (ROXICET) 5-325 MG tablet, Take 1-2 tablets by mouth every 4 (four) hours as needed for severe pain., Disp: 30 tablet, Rfl: 0 .  polyethylene glycol (MIRALAX / GLYCOLAX) packet, Take 17 g by mouth daily., Disp: , Rfl:  .  Multiple Vitamin (MULTIVITAMIN) capsule, Take 1 capsule by mouth daily. Centrum silver, Disp: , Rfl:  .  senna-docusate (SENOKOT-S) 8.6-50 MG tablet, Take 1 tablet by mouth 2 (two) times daily. While taking strong pain meds to prevent constipation. (Patient not taking: Reported on 10/30/2016), Disp: 30 tablet, Rfl: 0:  No Known Allergies:  No family history on file.:  Social History   Social History  . Marital status: Married    Spouse name: N/A  . Number of children: N/A  . Years of education: N/A   Occupational History  . Not on file.   Social History Main Topics  . Smoking status: Never Smoker  . Smokeless tobacco: Current User    Types: Chew, Snuff  . Alcohol use No  . Drug use: No  . Sexual activity: Not on file   Other Topics Concern  . Not on file   Social History Narrative  . No narrative on file  :  Pertinent items are noted in HPI.  Exam: Blood pressure 135/70, pulse 81, temperature 98.2 F (36.8 C), resp. rate 18, height 5\' 9"  (1.753 m), weight 212 lb 6.4 oz (96.3 kg), SpO2 100 %.  ECOG 1 General appearance: alert and cooperative appeared anxious. Head: Normocephalic, without obvious abnormality Throat:  lips, mucosa, and tongue normal; teeth and gums normal without oral thrush or ulcers. Neck: no adenopathy Back: negative Resp: clear to auscultation bilaterally no rhonchi, wheezes or dullness to percussion. Chest wall: no tenderness Cardio: regular rate and rhythm, S1, S2 normal, no murmur, click, rub or gallop GI: soft, non-tender; bowel sounds normal; no masses,  no organomegaly Extremities: extremities normal, atraumatic, no cyanosis or edema Skin: Skin color, texture, turgor normal. No rashes or lesions  CBC    Component Value Date/Time   WBC 7.5 10/02/2016 1010   RBC 3.87 (L) 10/02/2016 1010   HGB 8.9 (L) 10/09/2016 0516   HCT 28.8 (L) 10/09/2016 0516   PLT 404 (H) 10/02/2016 1010   MCV 70.5 (L) 10/02/2016 1010   MCH 20.9 (L) 10/02/2016 1010   MCHC 29.7 (L) 10/02/2016 1010   RDW 16.1 (H) 10/02/2016 1010   LYMPHSABS 0.9 12/22/2014 0906   MONOABS 0.7 12/22/2014 0906   EOSABS 0.2 12/22/2014 0906   BASOSABS 0.0 12/22/2014 0906     Dg Chest 2 View  Result Date: 10/02/2016 CLINICAL DATA:  Abnormal chest x-ray. EXAM: CHEST  2 VIEW COMPARISON:  None. FINDINGS: Prior CABG. Linear densities in the lingula most compatible with scarring. Right lung is clear. Heart is normal size. No effusions or acute bony abnormality. IMPRESSION: Lingular scarring.  No active disease. Electronically Signed   By: Rolm Baptise M.D.   On: 10/02/2016 13:03    Assessment and Plan:   83 year old Nathaniel Ponce with the following issues:  1. 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 This was diagnosed in November 2017. He is status post radical nephroureterectomy done on 10/08/2016. The final pathology did reveal a T4 lesion with focal liver metastasis that was biopsy-proven. No evidence of any distant metastasis noted on imaging studies.  The natural course of this disease was discussed with the patient and his family today. The role for  systemic chemotherapy was noted in this particular setting and certainly he would benefit from it. I fear that he has stage IV disease but that is not readily measurable. He is struggling with his recovery and wishing more time to allow for himself to go back to baseline function and gain his strength back before any consideration of treatment.  The reasonable approach at this time is to repeat imaging studies in 3 months which he is already set up to do so with Dr. Tresa Moore in April 2018. If he has measurable disease on the scan, systemic therapy will be discussed at that time. These options would include platinum-based regimen versus immune therapy. There is also consideration for a clinical trial that he could be eligible for given the fact that he could be platinum ineligible given his renal insufficiency and solitary kidney. The clinical trial will be utilized an experimental agents with immunotherapy.  After discussion today, he is agreeable with this plan and I will set up a follow-up for him in mid April after repeat imaging studies there are reports of April with Dr. Tresa Moore.  2. Anxiety: It is unclear to me if this is a mood disorder versus adjustment issues. Recommended follow-up with his primary care regarding this issue if this problem persists.  3. Insomnia: His insomnia a lot of it is related to nasal congestion and I recommended Benadryl to help with that temporarily.  4. Superficial bladder tumors: Continue to follow with Dr. Tresa Moore with surveillance cystoscopies.  5. Anemia: Multifactorial in nature likely related to his recent surgery and renal insufficiency.  All his questions were answered today to his satisfaction and follow-up will be arranged in April 2018.

## 2016-10-30 NOTE — Telephone Encounter (Signed)
Appointments scheduled per 1/25 LOS. Patient given AVS report and calendars with future scheduled appointments. °

## 2016-11-18 ENCOUNTER — Telehealth: Payer: Self-pay | Admitting: Oncology

## 2016-11-18 NOTE — Telephone Encounter (Signed)
FAXED PT OFFICE NOTE TO [facility] [phone]

## 2016-12-17 ENCOUNTER — Other Ambulatory Visit: Payer: Self-pay

## 2016-12-17 ENCOUNTER — Ambulatory Visit: Payer: Self-pay

## 2016-12-17 ENCOUNTER — Encounter: Payer: Self-pay | Admitting: Family Medicine

## 2016-12-17 ENCOUNTER — Ambulatory Visit (INDEPENDENT_AMBULATORY_CARE_PROVIDER_SITE_OTHER): Payer: Medicare Other | Admitting: Family Medicine

## 2016-12-17 ENCOUNTER — Ambulatory Visit (INDEPENDENT_AMBULATORY_CARE_PROVIDER_SITE_OTHER)
Admission: RE | Admit: 2016-12-17 | Discharge: 2016-12-17 | Disposition: A | Discharge status: Home / Self Care | Payer: Medicare Other | Source: Ambulatory Visit | Attending: Family Medicine | Admitting: Family Medicine

## 2016-12-17 VITALS — BP 130/78 | HR 75 | Ht 69.0 in | Wt 218.2 lb

## 2016-12-17 DIAGNOSIS — M545 Low back pain: Secondary | ICD-10-CM

## 2016-12-17 DIAGNOSIS — M1711 Unilateral primary osteoarthritis, right knee: Secondary | ICD-10-CM | POA: Diagnosis not present

## 2016-12-17 DIAGNOSIS — M5416 Radiculopathy, lumbar region: Secondary | ICD-10-CM | POA: Diagnosis not present

## 2016-12-17 DIAGNOSIS — M25561 Pain in right knee: Secondary | ICD-10-CM

## 2016-12-17 DIAGNOSIS — M1611 Unilateral primary osteoarthritis, right hip: Secondary | ICD-10-CM

## 2016-12-17 DIAGNOSIS — M25551 Pain in right hip: Secondary | ICD-10-CM

## 2016-12-17 DIAGNOSIS — M898X9 Other specified disorders of bone, unspecified site: Secondary | ICD-10-CM

## 2016-12-17 MED ORDER — GABAPENTIN 100 MG PO CAPS: 200.0000 mg | ORAL_CAPSULE | Freq: Every day | ORAL | 3 refills | Status: AC

## 2016-12-17 MED ORDER — TRAMADOL HCL 50 MG PO TABS: 50.0000 mg | ORAL_TABLET | Freq: Two times a day (BID) | ORAL | 0 refills | Status: AC | PRN

## 2016-12-17 MED ORDER — PREDNISONE 20 MG PO TABS: 40.0000 mg | ORAL_TABLET | Freq: Every day | ORAL | 0 refills | Status: AC

## 2016-12-17 NOTE — Patient Instructions (Signed)
Good to see you  Ice 20 minutes 2 times daily. Usually after activity and before bed. Prednisone daily for 5 days.  Gabapentin 200mg  at night If in a lot of pain tramadol up to 2 times a day but try it at home first it may knock your socks off Xrays today and see what is going on.  I will see you either Monday or Thursday up to you guys.

## 2016-12-17 NOTE — Assessment & Plan Note (Signed)
Patient is having more of a lumbar radiculopathy I think. Visit was causing weakness. Started on gabapentin short course of prednisone. Warned of potential side effects. Patient was accompanied with 2 daughters in all questions were answered. Follow-up again in the next 5-7 days to make sure patient is responding. Possible advance imaging could be warranted.

## 2016-12-17 NOTE — Assessment & Plan Note (Signed)
Patient doesn't more of the knee arthritis. We discussed with patient at great length. We discussed icing regimen and home exercises. Discussed with patient that I do think that some of the instability is likely more from the hip or even potentially a lumbar radiculopathy. Patient has had a history of cancer recently and we will get x-rays for further evaluation. Possible tibial plateau fracture is within the differential but no significant swelling. Likely more of a bone contusion. Discussed over-the-counter medications and please see medications prescribed. Patient will come back again in 1 week for further evaluation and we'll discuss further treatment options.

## 2016-12-17 NOTE — Progress Notes (Addendum)
[facility] [address], [place] [address] Phone: [phone] Subjective:    I'm seeing this patient by the request  of:   ADDIS,DANIEL, DO   CC:  Right knee pain  TDV:[id number]  Nathaniel Ponce is a 83 y.o. male coming in with complaint of right knee pain. Patient states that he twisted his knee getting out of a truck approximately 6 weeks ago. Since then it seems that the right leg has been getting worse. Patient describes the pain as a dull, throbbing aching sensation. Seems to start in the knee but can have pain in the right groin as well. Past medical history significant for some back trouble as well. Patient states that this is worsening in the taking daily activities. Becoming difficult to walk. Patient has had a CABG and is supposed be in cardiac rehabilitation but he is unable to do the exercises secondary to this discomfort. Has not notice any swelling. Not responding to over-the-counter medications. Patient is wanting to go out of the states to see her grandchild graduate next week but is concerned if he can will be able to do so. Rates the severity pain is 8 out of 10. Waking him up at night.    Past Medical History:  Diagnosis Date  . Anemia   . Cancer [facility]) 2009   bladder cancer  . Coronary artery disease    CABG 04/2016  . Dyslipidemia   . Dyslipidemia   . Dysrhythmia   . Hematuria   . Hemorrhoids   . History of bladder infections   . Hypertension   . Myocardial infarction 04/2016  . Neoplasm of bladder 2009  . Neoplasm of urethra   . Nocturia   . Obesity   . PONV (postoperative nausea and vomiting)   . Urinary frequency   . Wears glasses    Past Surgical History:  Procedure Laterality Date  . BLADDER SURGERY     TURBT-multiple last 05/22/2014  . CARDIAC CATHETERIZATION    . CORONARY ARTERY BYPASS GRAFT  04/14/2016   at [place] (dr Lincoln Brigham)   x3 grafts-- LIMA to LAD,  SVG to PDA,  SVG to OM  . CYSTOSCOPY    . CYSTOSCOPY WITH  STENT PLACEMENT Right 09/03/2016   Procedure: CYSTOSCOPY WITH STENT PLACEMENT;  Surgeon: Alexis Frock, MD;  Location: [facility];  Service: Urology;  Laterality: Right;  . CYSTOSCOPY/RETROGRADE/URETEROSCOPY Bilateral 09/03/2016   Procedure: CYSTOSCOPY/RETROGRADE/RIGHT DIAGNOSTIC URETEROSCOPY;  Surgeon: Alexis Frock, MD;  Location: [facility];  Service: Urology;  Laterality: Bilateral;  . HEMORROIDECTOMY    . ROBOT ASSITED LAPAROSCOPIC NEPHROURETERECTOMY Right 10/08/2016   Procedure: XI ROBOT ASSITED LAPAROSCOPIC NEPHROURETERECTOMY with liver biopsy;  Surgeon: Alexis Frock, MD;  Location: [facility];  Service: Urology;  Laterality: Right;   Social History   Social History  . Marital status: Married    Spouse name: N/A  . Number of children: N/A  . Years of education: N/A   Social History Main Topics  . Smoking status: Never Smoker  . Smokeless tobacco: Current User    Types: Chew, Snuff  . Alcohol use No  . Drug use: No  . Sexual activity: Not Asked   Other Topics Concern  . None   Social History Narrative  . None   No Known Allergies History reviewed. No pertinent family history.  Past medical history, social, surgical and family history all reviewed in electronic medical record.  No pertanent information unless stated regarding to  the chief complaint.   Review of Systems:Review of systems updated and as accurate as of 12/17/16  No headache, visual changes, nausea, vomiting, diarrhea, constipation, dizziness, abdominal pain, skin rash, fevers, chills, night sweats, weight loss, swollen lymph nodes, joint swelling, chest pain, shortness of breath, mood changes.  Positive muscle aches, body aches, Objective  Blood pressure 130/78, pulse 75, height 5\' 9"  (1.753 m), weight 218 lb 4 oz (99 kg), SpO2 99 %. Systems examined below as of 12/17/16   General: No apparent distress alert and oriented x3 mood and affect normal, dressed appropriately.    HEENT: Pupils equal, extraocular movements intact  Respiratory: Patient's speak in full sentences and does not appear short of breath  Cardiovascular: No lower extremity edema, non tender, no erythema  Skin: Warm dry intact with no signs of infection or rash on extremities or on axial skeleton.  Abdomen: Soft nontender  Neuro: Cranial nerves II through XII are intact, neurovascularly intact in all extremities with 2+ DTRs and 2+ pulses.  Lymph: No lymphadenopathy of posterior or anterior cervical chain or axillae bilaterally.  Gait severely antalgic gait.  MSK:  Mild tender with full range of motion and good stability and symmetric strength and tone of shoulders, elbows, wrist, , and ankles bilaterally.Significant arthritic changes of multiple joints  Right hip exam shows the patient does have severe loss of internal range of motion. Patient states that it is very painful. Neurovascularly intact distally. Negative Thompson test. Patient has weakness of the right hip 3 out of 5 compared to the contralateral side. Knee: Right valgus deformity noted.   Tender to palpation over medial and PF joint line.  ROM full in flexion and extension and lower leg rotation. instability with valgus force.  painful patellar compression. Patellar glide with moderate crepitus. Patellar and quadriceps tendons unremarkable. Hamstring and quadriceps strength is normal. Contralateral knee shows mild arthritic changes but nontender.  Back exam shows the patient does have decreasing range of motion in multiple planes. Positive straight leg test. Neurovascular intact distally. Weakness of hip flexion 3 out of 5 compared to the contralateral side. Deep tendon reflexes are intact.  MSK US performed of: Right knee This study was ordered, performed, and interpreted by Charlann Boxer D.O.  Knee: All structures visualized. Very mild narrowing noted of the medial compartment as well as the patellofemoral joint. No true  effusion noted. Patient does have an anterior aspect of the tibia that does have appears to be either a large bone contusion or potential avulsion fracture with no significant healing. Increasing Doppler flow noted.  IMPRESSION:  Mild knee arthritis with possible contusion    Impression and Recommendations:     This case required medical decision making of moderate complexity.      Note: This dictation was prepared with Dragon dictation along with smaller phrase technology. Any transcriptional errors that result from this process are unintentional.

## 2016-12-17 NOTE — Assessment & Plan Note (Signed)
X-rays are pending. Discussed with patient at great length. I do feel that this is likely contributing to most of it. I do think that this is been more chronic though. Depending on the severity was discussed possible intra-articular injections. Patient is not on a blood thinner but he would be a fairly high surgical risk so we'll try OTHER conservative therapy first. Differential still includes a lumbar radiculopathy that is likely causing more the weakness in the hip.

## 2016-12-18 ENCOUNTER — Ambulatory Visit (INDEPENDENT_AMBULATORY_CARE_PROVIDER_SITE_OTHER): Payer: Medicare Other | Admitting: Family Medicine

## 2016-12-18 ENCOUNTER — Telehealth: Payer: Self-pay | Admitting: Oncology

## 2016-12-18 ENCOUNTER — Encounter: Payer: Self-pay | Admitting: *Deleted

## 2016-12-18 ENCOUNTER — Ambulatory Visit (INDEPENDENT_AMBULATORY_CARE_PROVIDER_SITE_OTHER)
Admission: RE | Admit: 2016-12-18 | Discharge: 2016-12-18 | Disposition: A | Discharge status: Home / Self Care | Payer: Medicare Other | Source: Ambulatory Visit | Attending: Family Medicine | Admitting: Family Medicine

## 2016-12-18 ENCOUNTER — Encounter: Payer: Self-pay | Admitting: Family Medicine

## 2016-12-18 VITALS — BP 110/68 | HR 67 | Ht 69.0 in | Wt 218.2 lb

## 2016-12-18 DIAGNOSIS — M898X9 Other specified disorders of bone, unspecified site: Secondary | ICD-10-CM

## 2016-12-18 DIAGNOSIS — R300 Dysuria: Secondary | ICD-10-CM

## 2016-12-18 DIAGNOSIS — C641 Malignant neoplasm of right kidney, except renal pelvis: Secondary | ICD-10-CM | POA: Diagnosis not present

## 2016-12-18 DIAGNOSIS — M899 Disorder of bone, unspecified: Secondary | ICD-10-CM | POA: Diagnosis not present

## 2016-12-18 MED ORDER — IOPAMIDOL (ISOVUE-300) INJECTION 61%
80.0000 mL | Freq: Once | INTRAVENOUS | Status: AC | PRN
  Administered 2016-12-18: 80 mL via INTRAVENOUS

## 2016-12-18 NOTE — Assessment & Plan Note (Signed)
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 Looking at patient's bone changes there is a potential for more of a renal cell carcinoma. Discussed with patient as well as all his family at great length today. We discussed what this potentially means. Patient has been seen by oncology previously and I do believe he needs to be seen more acutely. We will call over to the office to see if we can make appointment. In the interim patient does have tramadol for any type of pain relief. We discussed with him as well as family that this does appear to be more of a stage IV metastasis. They are very realistic at this time as well. We did discuss some end-of-life planning but wanted to see oncology to see if there are any treatment options. DNR placed in chart.  Spent  45 minutes with patient face-to-face and had greater than 50% of counseling including as described above in assessment and plan.Marland Kitchen

## 2016-12-18 NOTE — Telephone Encounter (Signed)
Spoke with patient dtr re 3/19 f/u @ 10 am.

## 2016-12-18 NOTE — Progress Notes (Signed)
[facility] [place], [place] [address] Phone: [phone] Subjective:    I'm seeing this patient by the request  of:   ADDIS,DANIEL, DO   CC:  Right knee pain f/u  [id number]  Nathaniel Ponce is a 83 y.o. male coming in with complaint of right knee pain. Patient was seen yesterday. Patient is having pain that seemed to be more secondary to hip or potentially back. Did have a past medical history significant for bladder cancer. Patient was to get x-rays. X-rays did show that patient had a bony abnormality that could've been metastasis to the inferior pelvic rami on the right side. Patient then was sent for a CT scan. Patient is here to go over the CT scan.  CT scan was reviewed by me. Patient CT scan unfortunate shows multiple pulmonary nodules, liver lesions, retroperitoneal lymphadenopathy as well as a destructive erosive mass lesion of the right superior and inferior pubic rami  Past Medical History:  Diagnosis Date  . Anemia   . Cancer [facility]) 2009   bladder cancer  . Coronary artery disease    CABG 04/2016  . Dyslipidemia   . Dyslipidemia   . Dysrhythmia   . Hematuria   . Hemorrhoids   . History of bladder infections   . Hypertension   . Myocardial infarction 04/2016  . Neoplasm of bladder 2009  . Neoplasm of urethra   . Nocturia   . Obesity   . PONV (postoperative nausea and vomiting)   . Urinary frequency   . Wears glasses    Past Surgical History:  Procedure Laterality Date  . BLADDER SURGERY     TURBT-multiple last 05/22/2014  . CARDIAC CATHETERIZATION    . CORONARY ARTERY BYPASS GRAFT  04/14/2016   at [place] (dr Lincoln Brigham)   x3 grafts-- LIMA to LAD,  SVG to PDA,  SVG to OM  . CYSTOSCOPY    . CYSTOSCOPY WITH STENT PLACEMENT Right 09/03/2016   Procedure: CYSTOSCOPY WITH STENT PLACEMENT;  Surgeon: Alexis Frock, MD;  Location: [facility];  Service: Urology;  Laterality: Right;  .  CYSTOSCOPY/RETROGRADE/URETEROSCOPY Bilateral 09/03/2016   Procedure: CYSTOSCOPY/RETROGRADE/RIGHT DIAGNOSTIC URETEROSCOPY;  Surgeon: Alexis Frock, MD;  Location: [facility];  Service: Urology;  Laterality: Bilateral;  . HEMORROIDECTOMY    . ROBOT ASSITED LAPAROSCOPIC NEPHROURETERECTOMY Right 10/08/2016   Procedure: XI ROBOT ASSITED LAPAROSCOPIC NEPHROURETERECTOMY with liver biopsy;  Surgeon: Alexis Frock, MD;  Location: [facility];  Service: Urology;  Laterality: Right;   Social History   Social History  . Marital status: Married    Spouse name: N/A  . Number of children: N/A  . Years of education: N/A   Social History Main Topics  . Smoking status: Never Smoker  . Smokeless tobacco: Current User    Types: Chew, Snuff  . Alcohol use No  . Drug use: No  . Sexual activity: Not Asked   Other Topics Concern  . None   Social History Narrative  . None   No Known Allergies History reviewed. No pertinent family history.  Past medical history, social, surgical and family history all reviewed in electronic medical record.  No pertanent information unless stated regarding to the chief complaint.   Review of Systems:Review of systems updated and as accurate as of 12/18/16  Objective  Blood pressure 110/68, pulse 67, height 5\' 9"  (1.753 m), weight 218 lb 4 oz (99 kg), SpO2 98 %. Systems examined below as of 12/18/16  General: No apparent distress alert and oriented x3 mood and affect normal, dressed appropriately.  HEENT: Pupils equal, extraocular movements intact  Respiratory: Patient's speak in full sentences and does not appear short of breath  Cardiovascular: No lower extremity edema, non tender, no erythema  Skin: Warm dry intact with no signs of infection or rash on extremities or on axial skeleton.  Abdomen: Soft nontender  Neuro: Cranial nerves II through XII are intact, neurovascularly intact in all extremities with 2+ DTRs and 2+ pulses.  Lymph: No  lymphadenopathy of posterior or anterior cervical chain or axillae bilaterally.  Gait severely antalgic gait.  MSK:  Mild tender with full range of motion and good stability and symmetric strength and tone of shoulders, elbows, wrist, , and ankles bilaterally.Significant arthritic changes of multiple joints  Right hip exam shows the patient does have severe loss of internal range of motion. Patient states that it is very painful. Neurovascularly intact distally. Negative Thompson test. Patient has weakness of the right hip 3 out of 5 compared to the contralateral side. Knee: Right valgus deformity noted.   Tender to palpation over medial and PF joint line.  ROM full in flexion and extension and lower leg rotation. instability with valgus force.  painful patellar compression. Patellar glide with moderate crepitus. Patellar and quadriceps tendons unremarkable. Hamstring and quadriceps strength is normal. Contralateral knee shows mild arthritic changes but nontender.  Back exam shows the patient does have decreasing range of motion in multiple planes. Positive straight leg test. Neurovascular intact distally. Weakness of hip flexion 3 out of 5 compared to the contralateral side. Deep tendon reflexes are intact.  Patient's exam is exactly the same as yesterday. No changes made today but patient did have further evaluation.   Impression and Recommendations:     This case required medical decision making of moderate complexity.      Note: This dictation was prepared with Dragon dictation along with smaller phrase technology. Any transcriptional errors that result from this process are unintentional.

## 2016-12-19 ENCOUNTER — Other Ambulatory Visit: Payer: Self-pay

## 2016-12-19 ENCOUNTER — Other Ambulatory Visit: Payer: Self-pay | Admitting: Family Medicine

## 2016-12-19 ENCOUNTER — Encounter: Payer: Self-pay | Admitting: Family Medicine

## 2016-12-19 DIAGNOSIS — R6251 Failure to thrive (child): Secondary | ICD-10-CM

## 2016-12-19 MED ORDER — LORAZEPAM 0.5 MG PO TABS: 0.5000 mg | ORAL_TABLET | Freq: Two times a day (BID) | ORAL | 1 refills | Status: AC | PRN

## 2016-12-19 NOTE — Progress Notes (Signed)
Patient was seen yesterday and does have metastasis of his cancer. Patient is having more and more difficulty. Patient is to see oncology on Monday. At this point I do want a referral for palliative over the weekend. Patient's family states that he is getting weaker. We encouraged him to be seen in the emergency department for potential failure to thrive. Patient as well as family has declined that at this moment. We'll put in a referral for further evaluation over the weekend. Patient as well as family knows if worsening symptoms to please seek medical attention immediately. Patient will also be prescribed Ativan for any type of agitation over the weekend.

## 2016-12-19 NOTE — Progress Notes (Signed)
Fenton abdomen for agitation. Patient has had referral for palliative  medicine

## 2016-12-22 ENCOUNTER — Ambulatory Visit (HOSPITAL_BASED_OUTPATIENT_CLINIC_OR_DEPARTMENT_OTHER): Payer: Medicare Other | Admitting: Oncology

## 2016-12-22 ENCOUNTER — Other Ambulatory Visit: Payer: Self-pay | Admitting: *Deleted

## 2016-12-22 VITALS — BP 122/61 | HR 82 | Temp 98.1°F | Resp 17 | Ht 69.0 in | Wt 212.5 lb

## 2016-12-22 DIAGNOSIS — C787 Secondary malignant neoplasm of liver and intrahepatic bile duct: Secondary | ICD-10-CM | POA: Diagnosis not present

## 2016-12-22 DIAGNOSIS — M25551 Pain in right hip: Secondary | ICD-10-CM

## 2016-12-22 DIAGNOSIS — C641 Malignant neoplasm of right kidney, except renal pelvis: Secondary | ICD-10-CM

## 2016-12-22 DIAGNOSIS — R911 Solitary pulmonary nodule: Secondary | ICD-10-CM | POA: Diagnosis not present

## 2016-12-22 MED ORDER — HYDROCODONE-ACETAMINOPHEN 5-325 MG PO TABS: 1.0000 | ORAL_TABLET | Freq: Four times a day (QID) | ORAL | 0 refills | Status: DC | PRN

## 2016-12-22 NOTE — Progress Notes (Signed)
Spoke with patient and family re: hospice care. Gave pamphlets with frequently asked question and what hospice care provides. Let them know that a nurse will come to the home and do an assessment. And order any DME needed for patient care. Faxed  Demographics, med sheet, office notes, and scan results to [facility]. [phone]

## 2016-12-22 NOTE — Progress Notes (Signed)
Hematology and Oncology Follow Up Visit  Nathaniel Ponce [id number] 10-25-1933 83 y.o. 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 He presented with advanced disease and hepatic metastasis. Now has widespread metastatic disease.   Prior Therapy: He is status post radical nephroureterectomy done on 10/08/2016. The final pathology did reveal a T4 lesion with focal liver metastasis that was biopsy-proven. No evidence of any distant metastasis noted on imaging studies.  CT scan obtained in March 2018 showed widespread metastatic disease.  Current therapy: Supportive care and hospice enrollment in the near future.  Interim History: Nathaniel Ponce presents today for a follow-up visit. He he is a pleasant gentleman I saw and consultation back in January 2018. Since that visit, he has noted some decline in his overall health. He reported decline in his performance status and increase in his hip and right knee pain. He was seen by his primary care provider for knee pain on 12/17/2016 and underwent a CT scan of the abdomen and pelvis. His CT scan did show pulmonary nodules as well as widespread of his disease including liver lesions, retroperitoneal lymphadenopathy as well as right-sided hip involvement. He was prescribed by Dr. Tamala Julian prednisone which she has been taking.  Clinically, he has reported some improvement in his appetite on prednisone and his pain has improved slightly. He is mobility has been limited and has been predominantly limited to a chair. This pain is predominantly on the hip on the right side. He denied any chest pain or difficulty breathing. He does report lower extremity edema.  He does not report any headaches, blurry vision, syncope or seizures. He does not report any fevers, chills, sweats. He does not report any chest pain,  palpitation, orthopnea or leg edema. He does not report any cough, wheezing or hemoptysis. He does not report any nausea, vomiting or abdominal pain. He does not report constipation, diarrhea or hematochezia. He does not report any melena or early satiety. He does not report any frequency urgency or hesitancy. Remaining review of systems unremarkable.   Medications: I have reviewed the patient's current medications.  Current Outpatient Prescriptions  Medication Sig Dispense Refill  . ASPIRIN 81 PO Take by mouth.    Marland Kitchen atorvastatin (LIPITOR) 20 MG tablet Take 20 mg by mouth daily at 6 PM.    . citalopram (CELEXA) 20 MG tablet TAKE 1 TABLET ONCE A DAY ORALLY 30 DAY(S)  3  . diltiazem (DILACOR XR) 180 MG 24 hr capsule Take 180 mg by mouth daily.    . finasteride (PROSCAR) 5 MG tablet Take 5 mg by mouth daily.    . fluticasone (FLONASE) 50 MCG/ACT nasal spray Place 2 sprays into the nose daily as needed for allergies (runny nose).     Marland Kitchen gabapentin (NEURONTIN) 100 MG capsule Take 2 capsules (200 mg total) by mouth at bedtime. 60 capsule 3  . LORazepam (ATIVAN) 0.5 MG tablet Take 1 tablet (0.5 mg total) by mouth 2 (two) times daily as needed for anxiety. 20 tablet 1  . Multiple Vitamin (MULTIVITAMIN) capsule Take 1 capsule by mouth daily. Centrum silver    . polyethylene glycol (MIRALAX / GLYCOLAX) packet Take 17 g by mouth daily.    . predniSONE (DELTASONE) 20 MG tablet Take 2 tablets (40 mg total) by mouth daily with breakfast. 10 tablet 0  . senna-docusate (SENOKOT-S) 8.6-50 MG tablet Take 1 tablet  by mouth 2 (two) times daily. While taking strong pain meds to prevent constipation. 30 tablet 0  . traMADol (ULTRAM) 50 MG tablet Take 1 tablet (50 mg total) by mouth every 12 (twelve) hours as needed. 10 tablet 0  . HYDROcodone-acetaminophen (NORCO) 5-325 MG tablet Take 1 tablet by mouth every 6 (six) hours as needed for moderate pain. 30 tablet 0   No current facility-administered medications for this  visit.      Allergies: No Known Allergies  Past Medical History, Surgical history, Social history, and Family History were reviewed and updated.   Physical Exam: Blood pressure 122/61, pulse 82, temperature 98.1 F (36.7 C), temperature source Oral, resp. rate 17, height 5\' 9"  (1.753 m), weight 212 lb 8 oz (96.4 kg), SpO2 100 %. ECOG: 2 General appearance: alert and cooperative Head: Normocephalic, without obvious abnormality Neck: no adenopathy Lymph nodes: Cervical, supraclavicular, and axillary nodes normal. Heart:regular rate and rhythm, S1, S2 normal, no murmur, click, rub or gallop Lung:chest clear, no wheezing, rales, normal symmetric air entry, Heart exam - S1, S2 normal, no murmur, no gallop, rate regular Abdomin: soft, non-tender, without masses or organomegaly EXT:no erythema, induration, or nodules   Lab Results: Lab Results  Component Value Date   WBC 7.5 10/02/2016   HGB 8.9 (L) 10/09/2016   HCT 28.8 (L) 10/09/2016   MCV 70.5 (L) 10/02/2016   PLT 404 (H) 10/02/2016     Chemistry      Component Value Date/Time   NA 136 10/09/2016 0516   K 4.9 10/09/2016 0516   CL 103 10/09/2016 0516   CO2 23 10/09/2016 0516   BUN 18 10/09/2016 0516   CREATININE 1.43 (H) 10/09/2016 0516      Component Value Date/Time   CALCIUM 9.0 10/09/2016 0516   ALKPHOS 67 12/22/2014 0906   AST 23 12/22/2014 0906   ALT 26 12/22/2014 0906   BILITOT 0.5 12/22/2014 0906     IMPRESSION: Pulmonary nodules, liver lesions, retroperitoneal lymphadenopathy and destructive mass lesions in the right superior and inferior pubic rami are consistent with metastatic disease. Source of metastases cannot be definitively determined but the lesion in the right superior pubic ramus has an appearance most consistent with metastatic renal cell carcinoma. As noted above, this lesion destroys the anterior and medial walls of the right acetabulum.  Incompletely distended urinary bladder with  asymmetric wall thickening posteriorly and to the right which may represent the patient's known bladder carcinoma.  Status post right nephrectomy.  Atherosclerosis.  Sigmoid diverticulosis.    Impression and Plan:  83 year old gentleman with the following issues:  1. 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 This was diagnosed in November 2017. He is status post radical nephroureterectomy done on 10/08/2016. The final pathology did reveal a T4 lesion with focal liver metastasis that was biopsy-proven.  CT scan obtained on 12/18/2016 was reviewed today with the patient and his family. He has clear progression of disease that is rapid following his primary surgical therapy in January 2018. Options of therapy were reviewed today which include palliative chemotherapy versus supportive care only. Given the aggressive nature of his cancer his response to chemotherapy would be marginal at best and his performance status has already been declining. The benefits associated with this chemotherapy is marginal compared to the complications associated with it. Alternatively, supportive care  only and hospice would be a good option.  After discussion today, he declined systemic chemotherapy and elected  to proceed with hospice. We will make the appropriate referrals at this time.  2. Hip pain: Prescription for hydrocodone was given to the patient today. I will also make a referral for radiation oncology to evaluate him regarding potentially palliative radiation to the right hip that can be done in [place], [place] close to home.  3. Prognosis: Very poor with limited life expectancy of likely 6 months or less. He does qualify for hospice and he is agreeable to enroll. He is also aware of this prognosis which was discussed with him today in detail with his family.  4. Follow-up: Will be as needed and will allow on hospice for any  updates.     [facility], MD 3/19/201811:59 AM

## 2016-12-24 ENCOUNTER — Encounter: Payer: Self-pay | Admitting: *Deleted

## 2016-12-25 ENCOUNTER — Ambulatory Visit: Payer: Medicare Other | Admitting: Family Medicine

## 2017-01-01 ENCOUNTER — Other Ambulatory Visit: Payer: Self-pay | Admitting: *Deleted

## 2017-01-01 DIAGNOSIS — C641 Malignant neoplasm of right kidney, except renal pelvis: Secondary | ICD-10-CM

## 2017-01-01 MED ORDER — HYDROCODONE-ACETAMINOPHEN 5-325 MG PO TABS: 1.0000 | ORAL_TABLET | Freq: Four times a day (QID) | ORAL | 0 refills | Status: AC | PRN

## 2017-01-15 ENCOUNTER — Ambulatory Visit: Payer: Medicare Other | Admitting: Oncology

## 2017-01-15 ENCOUNTER — Ambulatory Visit: Payer: Medicare Other | Admitting: Radiation Oncology

## 2017-01-15 ENCOUNTER — Ambulatory Visit: Payer: Medicare Other

## 2017-02-05 ENCOUNTER — Telehealth: Payer: Self-pay

## 2017-02-05 NOTE — Telephone Encounter (Signed)
Nathaniel Ponce at [facility] in [place] called to inform Dr Alen Blew that pt passed away at 840 PM last night.  HIM notified.

## 2017-03-06 DEATH — deceased

## 2018-04-25 IMAGING — DX DG CHEST 2V
2 series · 2 of 2 positions shown · non-contrast
Comparison: None.

CLINICAL DATA: Abnormal chest x-ray.

EXAM:
CHEST  2 VIEW

[chest pa]
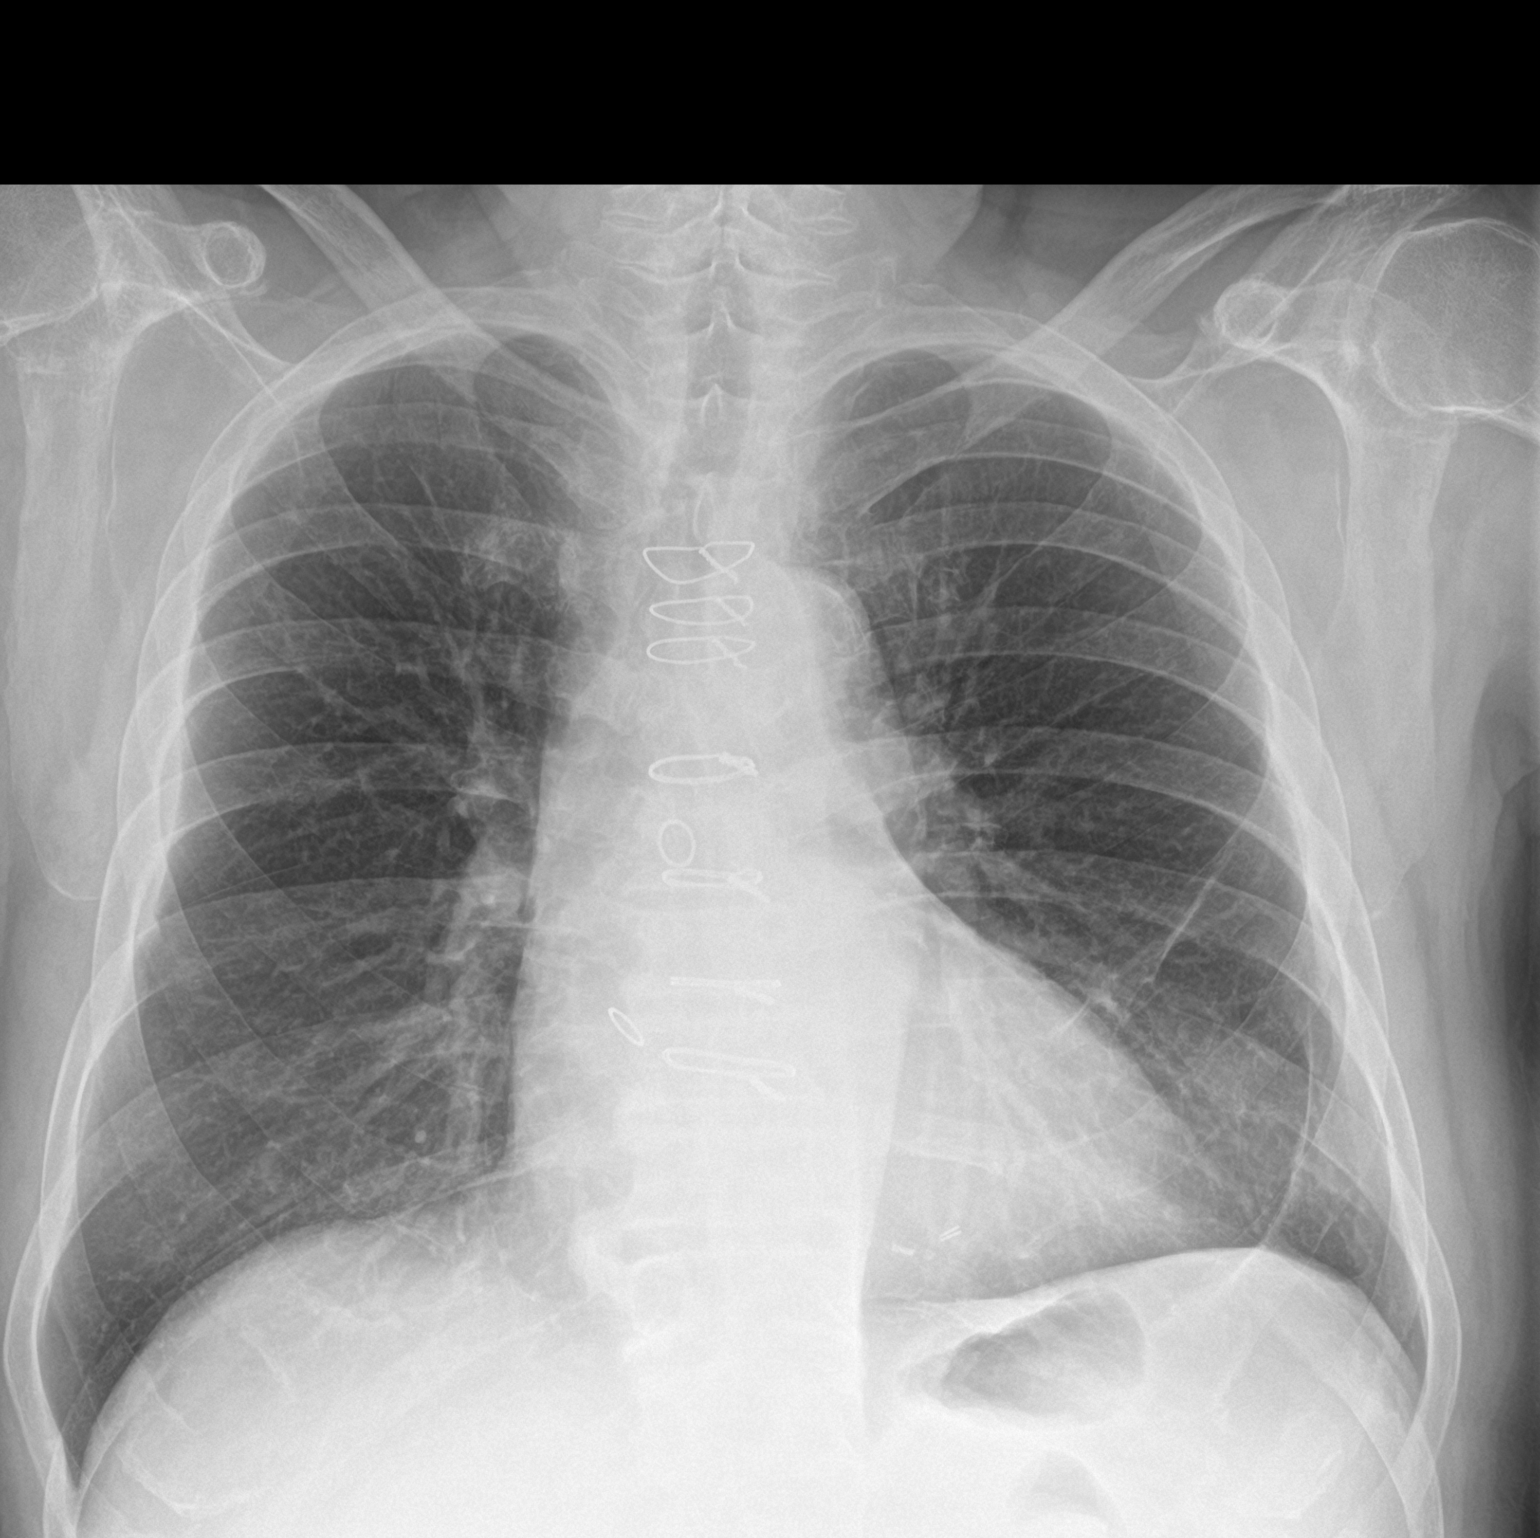

[chest lat]
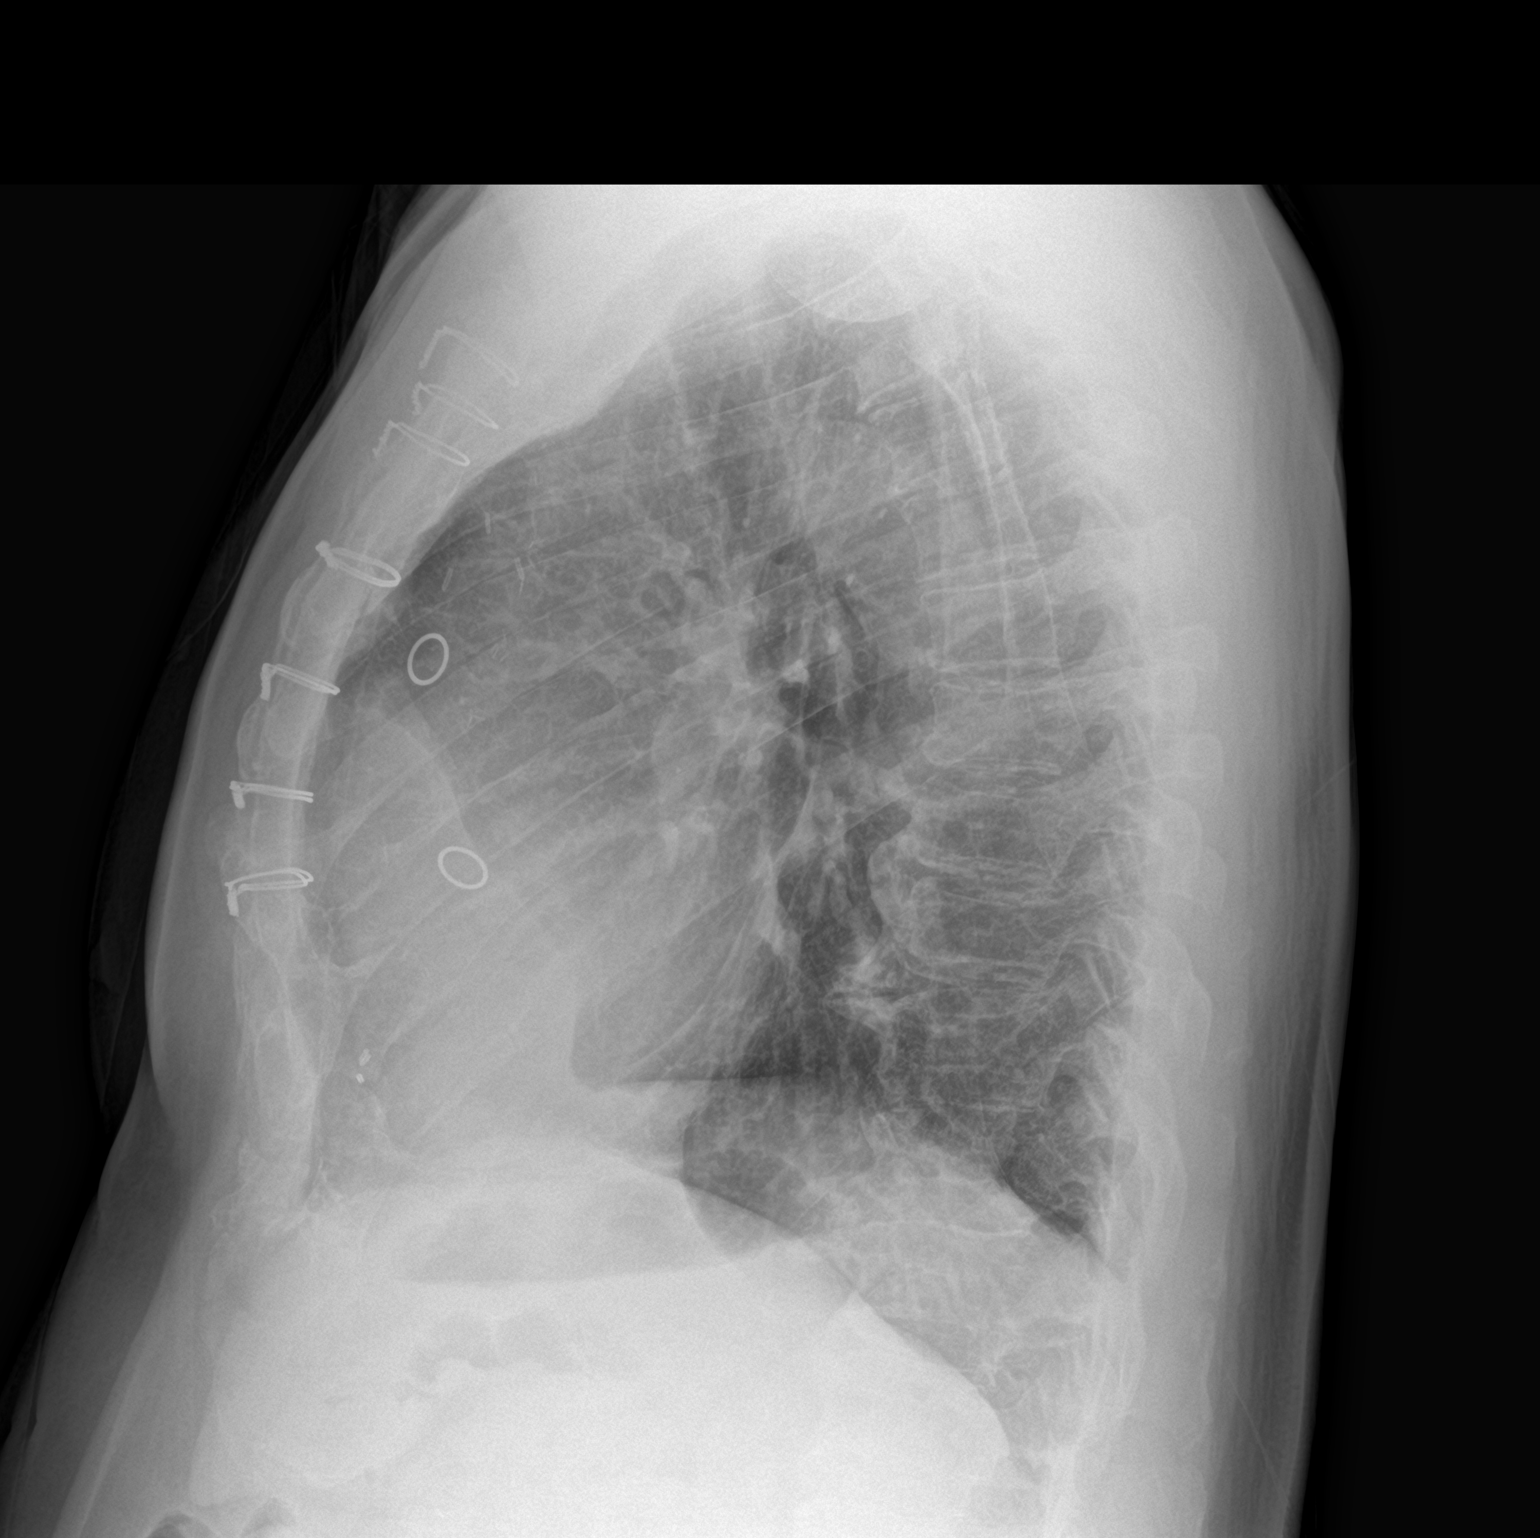

[2 of 2 positions shown; findings below may reference images not displayed]

FINDINGS: Prior CABG. Linear densities in the lingula most compatible with
scarring. Right lung is clear. Heart is normal size. No effusions or
acute bony abnormality.
IMPRESSION: Lingular scarring.  No active disease.

## 2018-07-11 IMAGING — CT CT ABD-PELV W/ CM
2 of 6 series · 14 of 46 positions shown, 16 images · IV contrast (ISOVUE 300)
Comparison: CT abdomen and pelvis 12/22/2014.

CLINICAL DATA: Right hip, femur and groin pain for 6 weeks. History
of bladder carcinoma treated with chemotherapy in 6676. Status post
right nephrectomy

EXAM:
CT ABDOMEN AND PELVIS WITH CONTRAST
TECHNIQUE: Multidetector CT imaging of the abdomen and pelvis was performed
using the standard protocol following bolus administration of
intravenous contrast.
CONTRAST:  80 ml ROK48R-FXX IOPAMIDOL (ROK48R-FXX) INJECTION 61%

[Series 2: abd/pel w · axial · 0.81mm/px · z∈[-511,-136]mm · 11 of 91 slices shown, 13 images]
[im 8/91  soft-tissue]
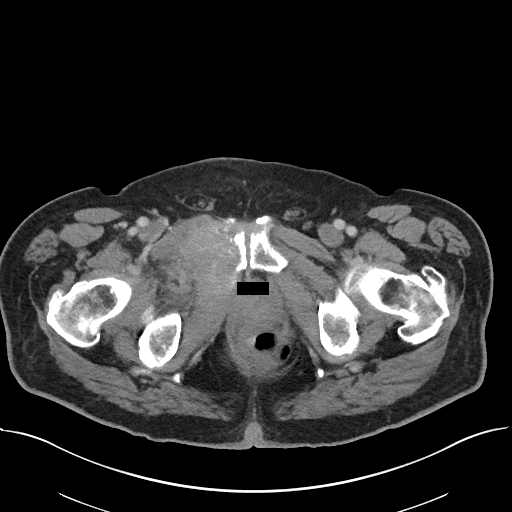
[im 8/91  bone]
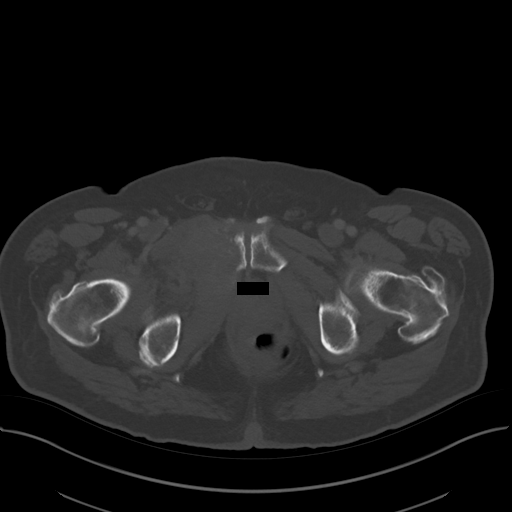
[im 16/91  soft-tissue]
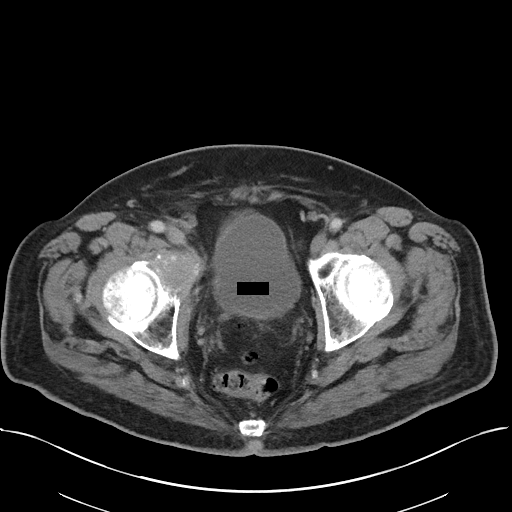
[im 23/91  soft-tissue]
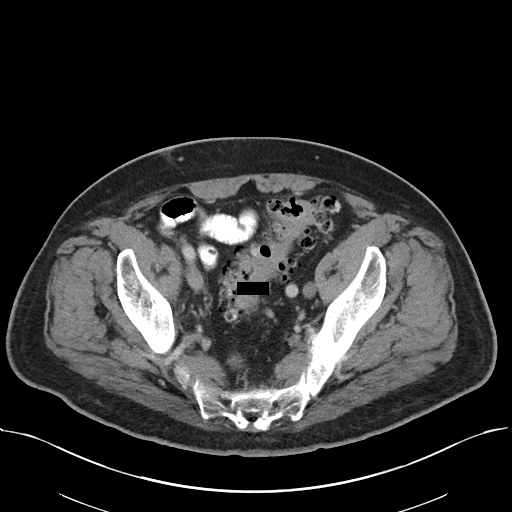
[im 31/91  soft-tissue]
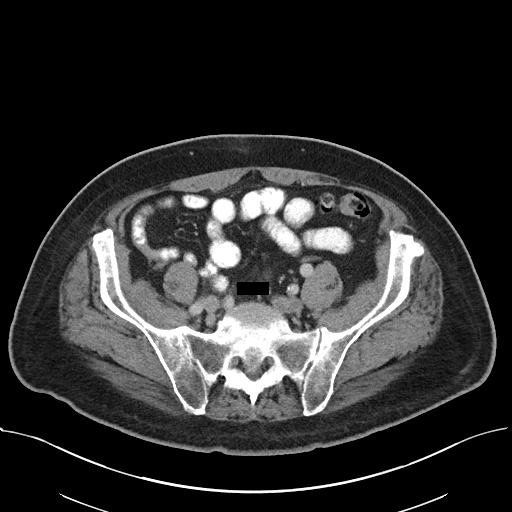
[im 38/91  soft-tissue]
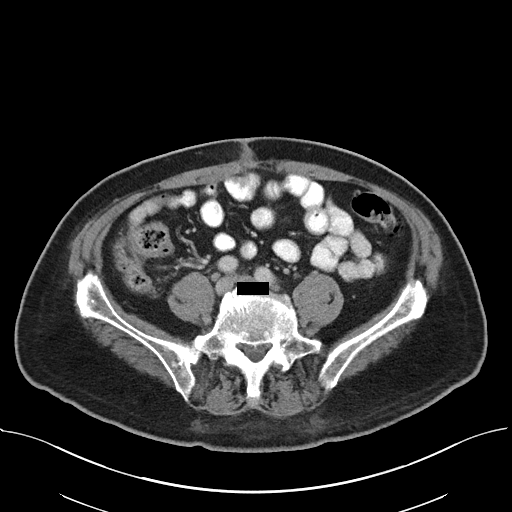
[im 46/91  soft-tissue]
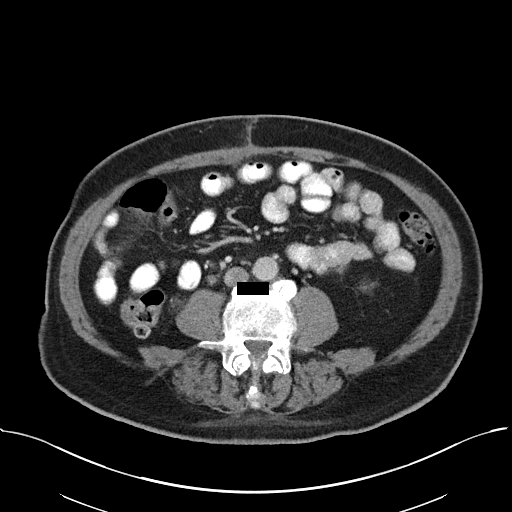
[im 53/91  soft-tissue]
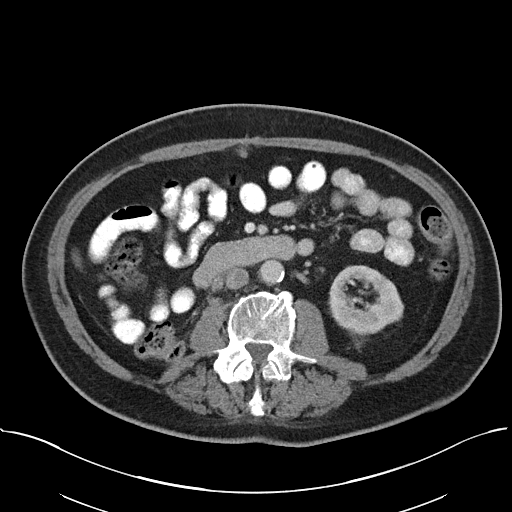
[im 61/91  soft-tissue]
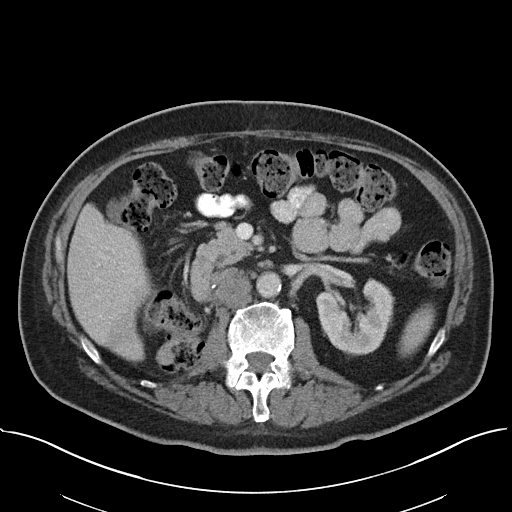
[im 68/91  soft-tissue]
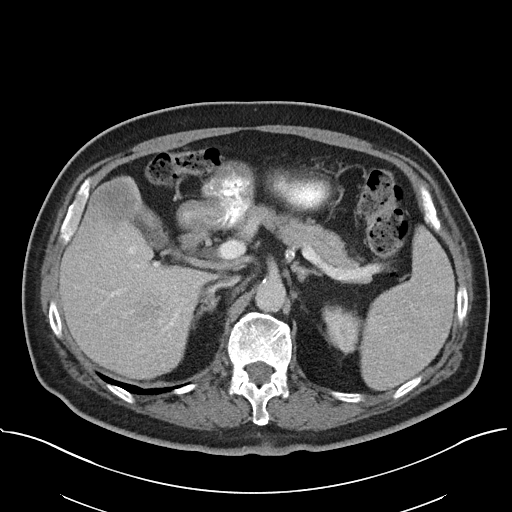
[im 68/91  bone]
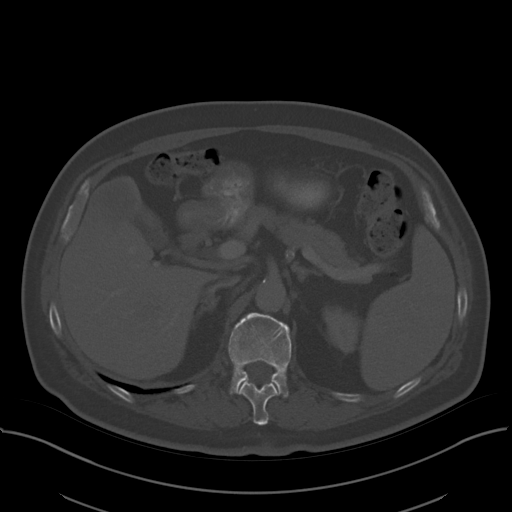
[im 76/91  soft-tissue]
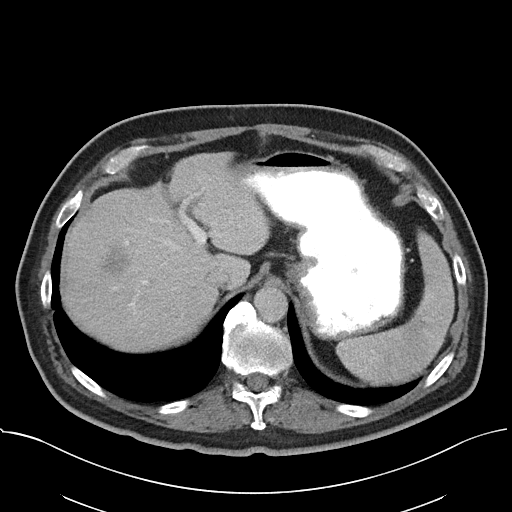
[im 83/91  soft-tissue]
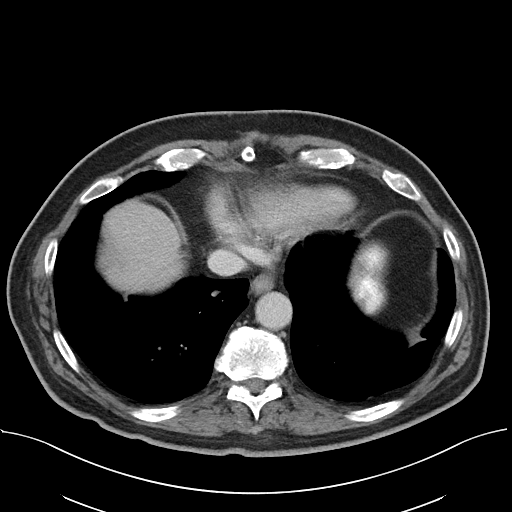

[Series 5: abd/pel w st · coronal · 0.78mm/px · 3 of 88 slices shown]
[im 30/88  soft-tissue]
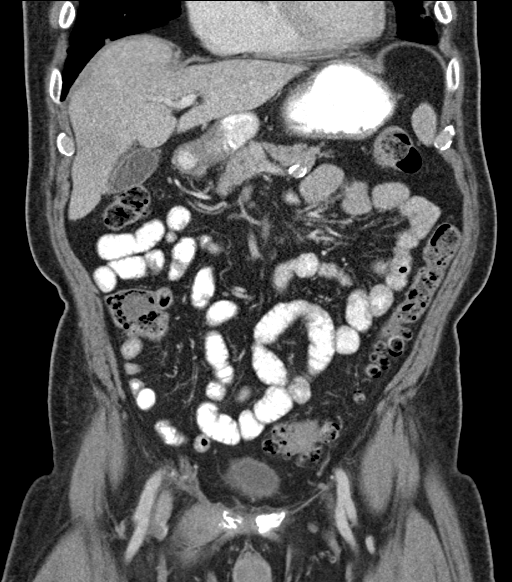
[im 39/88  soft-tissue]
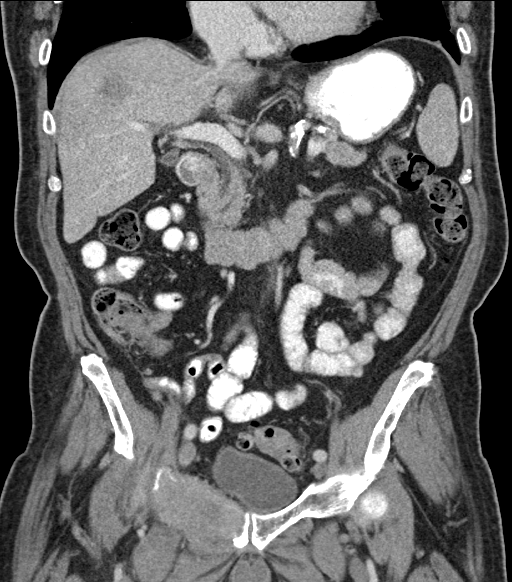
[im 49/88  soft-tissue]
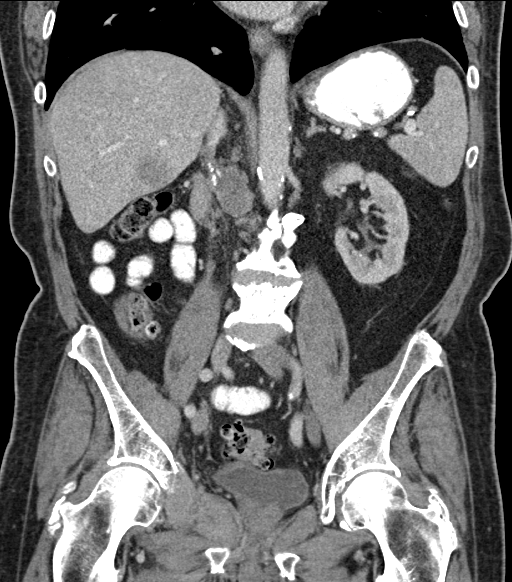

[14 of 46 positions shown; findings below may reference images not displayed]

FINDINGS: Lower chest: A 0.6 cm nodule in the left lower lobe on image 7 is
new since the prior examination. A 1.4 cm nodule in the right lower
lobe on image 6 is also new. A few additional smaller nodules are
identified. No pleural or pericardial effusion.

Hepatobiliary: Multiple large liver lesions are seen consistent with
metastatic disease. The largest is in the inferior right hepatic
lobe on image twenty-seven measuring 3.4 x 4.3 cm on image 27.
Second lesion in the inferior right hepatic lobe on image 24
measures 3.5 x 3.8 cm. Third lesion the right hepatic lobe inferior
to the dome of the liver measures 2.7 x 2.0 cm on images 15. The
gallbladder and biliary tree are unremarkable.

Pancreas: Unremarkable. No pancreatic ductal dilatation or
surrounding inflammatory changes.

Spleen: A few small low attenuating lesions of the spleen are
identified. The largest measures 0.7 cm on image 18.

Adrenals/Urinary Tract: The right kidney has been removed. The left
kidney and ureter are unremarkable. The bladder is nearly completely
decompressed. Asymmetric wall thickening posteriorly and to the
right is identified. The adrenal glands appear normal.

Stomach/Bowel: The appendix has been removed. There is sigmoid
diverticulosis without diverticulitis. The stomach and small bowel
appear normal.

Vascular/Lymphatic: Interaortocaval lymph node on image 35 measures
1.4 cm short axis dimension. Node posterior to the inferior vena
cava measures 2.8 cm transverse by 2.6 cm AP by 4.0 cm craniocaudal
on axial image 32 and sagittal image 53. Atherosclerotic vascular
disease identified. No aneurysm.

Reproductive: Prostate gland is unremarkable.

Other: No fluid collection.

Musculoskeletal: A destructive mass lesion replaces the right
superior pubic ramus and measures approximately 7.1 cm transverse by
6.0 cm AP. The lesion destroys a segment of the anterior and medial
walls of the right acetabulum. A lytic lesion in the central aspect
of the inferior right pubic ramus measures 2.1 cm transverse. No
other destructive lesion is identified. The patient has remote
fracture of the posterior arc of the right twelfth rib.
IMPRESSION: Pulmonary nodules, liver lesions, retroperitoneal lymphadenopathy
and destructive mass lesions in the right superior and inferior
pubic rami are consistent with metastatic disease. Source of
metastases cannot be definitively determined but the lesion in the
right superior pubic ramus has an appearance most consistent with
metastatic renal cell carcinoma. As noted above, this lesion
destroys the anterior and medial walls of the right acetabulum.

Incompletely distended urinary bladder with asymmetric wall
thickening posteriorly and to the right which may represent the
patient's known bladder carcinoma.

Status post right nephrectomy.

Atherosclerosis.

Sigmoid diverticulosis.
